# Patient Record
Sex: Female | Born: 1990 | Race: White | Hispanic: Yes | Marital: Single | Smoking: Former smoker
Health system: Southern US, Community
[De-identification: ages and names within clinical notes are randomized; demographics above are authoritative.]

## PROBLEM LIST (undated history)

## (undated) DIAGNOSIS — M5020 Other cervical disc displacement, unspecified cervical region: Secondary | ICD-10-CM

## (undated) HISTORY — PX: NO PAST SURGERIES: SHX2092

## (undated) HISTORY — PX: WISDOM TOOTH EXTRACTION: SHX21

## (undated) HISTORY — DX: Other cervical disc displacement, unspecified cervical region: M50.20

---

## 2014-03-04 ENCOUNTER — Emergency Department (HOSPITAL_BASED_OUTPATIENT_CLINIC_OR_DEPARTMENT_OTHER)
Admission: EM | Admit: 2014-03-04 | Discharge: 2014-03-04 | Disposition: A | Discharge status: Home / Self Care | Payer: PRIVATE HEALTH INSURANCE | Attending: Emergency Medicine | Admitting: Emergency Medicine

## 2014-03-04 ENCOUNTER — Encounter (HOSPITAL_BASED_OUTPATIENT_CLINIC_OR_DEPARTMENT_OTHER): Payer: Self-pay | Admitting: *Deleted

## 2014-03-04 DIAGNOSIS — M5441 Lumbago with sciatica, right side: Secondary | ICD-10-CM | POA: Insufficient documentation

## 2014-03-04 DIAGNOSIS — M545 Low back pain: Secondary | ICD-10-CM | POA: Diagnosis present

## 2014-03-04 MED ORDER — NAPROXEN 500 MG PO TABS: 500.0000 mg | ORAL_TABLET | Freq: Two times a day (BID) | ORAL | Status: DC

## 2014-03-04 MED ORDER — DIAZEPAM 5 MG PO TABS
5.0000 mg | ORAL_TABLET | Freq: Once | ORAL | Status: AC
  Administered 2014-03-04: 5 mg via ORAL
  Filled 2014-03-04: qty 1

## 2014-03-04 MED ORDER — OXYCODONE-ACETAMINOPHEN 5-325 MG PO TABS: 1.0000 | ORAL_TABLET | Freq: Four times a day (QID) | ORAL | Status: DC | PRN

## 2014-03-04 MED ORDER — IBUPROFEN 800 MG PO TABS
800.0000 mg | ORAL_TABLET | Freq: Once | ORAL | Status: AC
  Administered 2014-03-04: 800 mg via ORAL
  Filled 2014-03-04: qty 1

## 2014-03-04 MED ORDER — CYCLOBENZAPRINE HCL 5 MG PO TABS: 5.0000 mg | ORAL_TABLET | Freq: Three times a day (TID) | ORAL | Status: DC | PRN

## 2014-03-04 MED ORDER — PREDNISONE 50 MG PO TABS: 50.0000 mg | ORAL_TABLET | Freq: Every day | ORAL | Status: DC

## 2014-03-04 MED ORDER — OXYCODONE-ACETAMINOPHEN 5-325 MG PO TABS
1.0000 | ORAL_TABLET | Freq: Once | ORAL | Status: AC
Start: 2014-03-04 — End: 2014-03-04
  Administered 2014-03-04: 1 via ORAL
  Filled 2014-03-04: qty 1

## 2014-03-04 NOTE — ED Provider Notes (Signed)
CSN: [id number]     Arrival date & time 03/04/14  1508 History   First MD Initiated Contact with Patient 03/04/14 1514     Chief Complaint  Patient presents with  . Back Pain     (Consider location/radiation/quality/duration/timing/severity/associated sxs/prior Treatment) HPI  Pt presents with c/o low back pain.  She states she has been having pain for the past 2 weeks- she has been seeing a chiropractor and initially thought this was helping, however when she tried to do laundry and other household chores she had increased low back pain again.  Pain worse with movement and palpation.  No fever/chills.  No weakness of legs, no incontinence of bowel or bladder and no urinary retention.  She has been trying ibuprofen for her symptoms.  Her mother is requesting that she be given steroids as she had them once before and it helped mother a lot. There are no other associated systemic symptoms, there are no other alleviating or modifying factors.  No dysuria.   History reviewed. No pertinent past medical history. History reviewed. No pertinent past surgical history. No family history on file. History  Substance Use Topics  . Smoking status: Never Smoker   . Smokeless tobacco: Not on file  . Alcohol Use: No   OB History    No data available     Review of Systems  ROS reviewed and all otherwise negative except for mentioned in HPI    Allergies  Review of patient's allergies indicates no known allergies.  Home Medications   Prior to Admission medications   Medication Sig Start Date End Date Taking? Authorizing Provider  cyclobenzaprine (FLEXERIL) 5 MG tablet Take 1 tablet (5 mg total) by mouth 3 (three) times daily as needed for muscle spasms. 03/04/14   Ethelda Chick, MD  naproxen (NAPROSYN) 500 MG tablet Take 1 tablet (500 mg total) by mouth 2 (two) times daily. 03/04/14   Ethelda Chick, MD  oxyCODONE-acetaminophen (PERCOCET/ROXICET) 5-325 MG per tablet Take 1-2 tablets by mouth  every 6 (six) hours as needed for severe pain. 03/04/14   Ethelda Chick, MD  predniSONE (DELTASONE) 50 MG tablet Take 1 tablet (50 mg total) by mouth daily. 03/04/14   Ethelda Chick, MD   BP 150/79 mmHg  Pulse 84  Temp(Src) 98.7 F (37.1 C) (Oral)  Resp 18  SpO2 100%  LMP 02/03/2014  Vitals reviewed Physical Exam  Physical Examination: General appearance - alert, well appearing, and in no distress Mental status - alert, oriented to person, place, and time Eyes - no conjunctival injection, no scleral icterus Mouth - mucous membranes moist, pharynx normal without lesions Chest - clear to auscultation, no wheezes, rales or rhonchi, symmetric air entry Heart - normal rate, regular rhythm, normal S1, S2, no murmurs, rubs, clicks or gallops Back exam - no midline tenderness to palpation, right lumbar paraspinal tenderness, no CVA tenderness Neurological - alert, oriented x 3, strength 5/5 in extremities x 4, sensation intact Extremities - peripheral pulses normal, no pedal edema, no clubbing or cyanosis Skin - normal coloration and turgor, no rashes  ED Course  Procedures (including critical care time) Labs Review Labs Reviewed - No data to display  Imaging Review No results found.   EKG Interpretation None      MDM   Final diagnoses:  Right-sided low back pain with right-sided sciatica    Pt presenting with right sided low back pain.  No midline tendenress to warrant imaging, no trauma.  Neuro  exam normal.  No signs or symptoms of cauda equina.  No fever to suggest epidural abscess.  Pt treated with pain meds, muscle relaxer, steroid course.  Discharged with strict return precautions.  Pt agreeable with plan.    Ethelda ChickMartha K Linker, MD 03/07/14 1017

## 2014-03-04 NOTE — Discharge Instructions (Signed)
Return to the ED with any concerns including weakness of legs, not able to urinate, loss of control of bowel or bladder, decreased level of alertness/lethargy, or any other alarming symptoms °

## 2014-03-04 NOTE — ED Notes (Signed)
Lower back pain x 2 weeks. States she thinks she has sciatica.

## 2014-09-10 LAB — OB RESULTS CONSOLE GC/CHLAMYDIA
Chlamydia: NEGATIVE
Gonorrhea: NEGATIVE

## 2014-09-28 LAB — OB RESULTS CONSOLE VARICELLA ZOSTER ANTIBODY, IGG: VARICELLA IGG: IMMUNE

## 2014-09-28 LAB — OB RESULTS CONSOLE HGB/HCT, BLOOD
HCT: 35 %
Hemoglobin: 11.2 g/dL

## 2014-09-28 LAB — OB RESULTS CONSOLE HIV ANTIBODY (ROUTINE TESTING): HIV: NONREACTIVE

## 2014-09-28 LAB — OB RESULTS CONSOLE RUBELLA ANTIBODY, IGM: RUBELLA: IMMUNE

## 2014-09-28 LAB — OB RESULTS CONSOLE ABO/RH: RH Type: POSITIVE

## 2014-09-28 LAB — OB RESULTS CONSOLE HEPATITIS B SURFACE ANTIGEN: HEP B S AG: NEGATIVE

## 2014-09-28 LAB — OB RESULTS CONSOLE PLATELET COUNT: Platelets: 185 10*3/uL

## 2014-09-28 LAB — OB RESULTS CONSOLE ANTIBODY SCREEN: Antibody Screen: NEGATIVE

## 2014-11-24 ENCOUNTER — Encounter: Payer: Self-pay | Admitting: *Deleted

## 2014-11-24 DIAGNOSIS — Z3402 Encounter for supervision of normal first pregnancy, second trimester: Secondary | ICD-10-CM

## 2014-11-24 DIAGNOSIS — Z3400 Encounter for supervision of normal first pregnancy, unspecified trimester: Secondary | ICD-10-CM | POA: Insufficient documentation

## 2014-11-30 ENCOUNTER — Encounter: Payer: Self-pay | Admitting: Advanced Practice Midwife

## 2014-11-30 ENCOUNTER — Ambulatory Visit (INDEPENDENT_AMBULATORY_CARE_PROVIDER_SITE_OTHER): Payer: Self-pay | Admitting: Advanced Practice Midwife

## 2014-11-30 VITALS — BP 107/44 | HR 75 | Temp 98.6°F | Ht 67.0 in | Wt 236.0 lb

## 2014-11-30 DIAGNOSIS — Z3402 Encounter for supervision of normal first pregnancy, second trimester: Secondary | ICD-10-CM

## 2014-11-30 DIAGNOSIS — Z3A21 21 weeks gestation of pregnancy: Secondary | ICD-10-CM

## 2014-11-30 LAB — POCT URINALYSIS DIP (DEVICE)
Bilirubin Urine: NEGATIVE
Glucose, UA: NEGATIVE mg/dL
Hgb urine dipstick: NEGATIVE
Ketones, ur: NEGATIVE mg/dL
LEUKOCYTES UA: NEGATIVE
NITRITE: NEGATIVE
Protein, ur: NEGATIVE mg/dL
Specific Gravity, Urine: 1.025 (ref 1.005–1.030)
Urobilinogen, UA: 0.2 mg/dL (ref 0.0–1.0)
pH: 6 (ref 5.0–8.0)

## 2014-11-30 NOTE — Progress Notes (Signed)
Initial prenatal education packet given Breastfeeding tip of the week reviewed Unsure about flu

## 2014-11-30 NOTE — Progress Notes (Signed)
   Subjective:    Charlett NoseLianaivette [place] is a G1P0000 6752w3d being seen today for her first obstetrical visit as [facility].  Her obstetrical history is significant for none G1. Patient does intend to breast feed. Pregnancy history fully reviewed.  Patient reports no complaints.  Filed Vitals:   11/30/14 0818 11/30/14 0825  BP: 107/44   Pulse: 75   Temp: 98.6 F (37 C)   Height:  5\' 7"  (1.702 m)  Weight: 236 lb (107.049 kg)     HISTORY: OB History  Gravida Para Term Preterm AB SAB TAB Ectopic Multiple Living  1 0 0 0 0 0 0 0 0 0     # Outcome Date GA Lbr Len/2nd Weight Sex Delivery Anes PTL Lv  1 Current              Past Medical History  Diagnosis Date  . Herniated disc, cervical     sees chiropractor   Past Surgical History  Procedure Laterality Date  . No past surgeries     Family History  Problem Relation Age of Onset  . Asthma Mother   . Diabetes Maternal Aunt   . Diabetes Maternal Grandmother   . Diabetes Maternal Grandfather   . Diabetes Paternal Grandfather      Exam    Uterus:   Fundal height slightly above umbilicus  Pelvic Exam: Deferred--records reviewed, Pap wnl   Skin: normal coloration and turgor, no rashes    Neurologic: oriented, normal, gait normal; reflexes normal and symmetric   Extremities: normal strength, tone, and muscle mass, ROM of all joints is normal   HEENT sclera clear, anicteric   Mouth/Teeth mucous membranes moist, pharynx normal without lesions and dental hygiene good   Neck supple and no masses   Cardiovascular:    Respiratory:  appears well, vitals normal, no respiratory distress, acyanotic, normal RR, ear and throat exam is normal, neck free of mass or lymphadenopathy   Abdomen: soft, non-tender; bowel sounds normal; no masses,  no organomegaly   Urinary:       Assessment:    Pregnancy: G1P0000 Patient Active Problem List   Diagnosis Date Noted  . Supervision of normal first pregnancy 11/24/2014         Plan:     Initial labs reviewed in transfer records Continue prenatal vitamins. Problem list reviewed and updated. Genetic Screening discussed Quad Screen: results reviewed.  Ultrasound discussed; fetal survey: ordered.  Follow up in 3 weeks. Pt is self pay for today's visit, should have new insurance in 3 weeks.  US and next visit scheduled after insurance starts.  Discussed costs of visit with pt, consulted Mayra NeerKelly Rassette, and pt questions answered about billing.   LEFTWICH-KIRBY, Demontrae Gilbert 11/30/2014

## 2014-12-01 LAB — PRESCRIPTION MONITORING PROFILE (19 PANEL)
AMPHETAMINE/METH: NEGATIVE ng/mL
BARBITURATE SCREEN, URINE: NEGATIVE ng/mL
Benzodiazepine Screen, Urine: NEGATIVE ng/mL
Buprenorphine, Urine: NEGATIVE ng/mL
CANNABINOID SCRN UR: NEGATIVE ng/mL
CARISOPRODOL, URINE: NEGATIVE ng/mL
Cocaine Metabolites: NEGATIVE ng/mL
Creatinine, Urine: 186.66 mg/dL (ref >20.0)
ECSTASY: NEGATIVE ng/mL
Fentanyl, Ur: NEGATIVE ng/mL
MEPERIDINE UR: NEGATIVE ng/mL
Methadone Screen, Urine: NEGATIVE ng/mL
Methaqualone: NEGATIVE ng/mL
Nitrites, Initial: NEGATIVE ug/mL
OPIATE SCREEN, URINE: NEGATIVE ng/mL
Oxycodone Screen, Ur: NEGATIVE ng/mL
Phencyclidine, Ur: NEGATIVE ng/mL
Propoxyphene: NEGATIVE ng/mL
Tapentadol, urine: NEGATIVE ng/mL
Tramadol Scrn, Ur: NEGATIVE ng/mL
Zolpidem, Urine: NEGATIVE ng/mL
pH, Initial: 6.1 pH (ref 4.5–8.9)

## 2014-12-02 LAB — CULTURE, OB URINE
Colony Count: NO GROWTH
Organism ID, Bacteria: NO GROWTH

## 2014-12-21 ENCOUNTER — Encounter: Payer: Self-pay | Admitting: Family Medicine

## 2014-12-21 ENCOUNTER — Ambulatory Visit (INDEPENDENT_AMBULATORY_CARE_PROVIDER_SITE_OTHER): Payer: Self-pay | Admitting: Certified Nurse Midwife

## 2014-12-21 ENCOUNTER — Other Ambulatory Visit: Payer: Self-pay | Admitting: Advanced Practice Midwife

## 2014-12-21 ENCOUNTER — Ambulatory Visit (HOSPITAL_COMMUNITY)
Admission: RE | Admit: 2014-12-21 | Discharge: 2014-12-21 | Disposition: A | Discharge status: Home / Self Care | Payer: Medicaid Other | Source: Ambulatory Visit | Attending: Advanced Practice Midwife | Admitting: Advanced Practice Midwife

## 2014-12-21 VITALS — BP 119/69 | HR 75 | Temp 98.2°F | Wt 235.6 lb

## 2014-12-21 DIAGNOSIS — Z3A21 21 weeks gestation of pregnancy: Secondary | ICD-10-CM

## 2014-12-21 DIAGNOSIS — O99212 Obesity complicating pregnancy, second trimester: Secondary | ICD-10-CM | POA: Insufficient documentation

## 2014-12-21 DIAGNOSIS — Z3689 Encounter for other specified antenatal screening: Secondary | ICD-10-CM

## 2014-12-21 DIAGNOSIS — Z3A24 24 weeks gestation of pregnancy: Secondary | ICD-10-CM | POA: Diagnosis not present

## 2014-12-21 DIAGNOSIS — O0932 Supervision of pregnancy with insufficient antenatal care, second trimester: Secondary | ICD-10-CM | POA: Diagnosis not present

## 2014-12-21 DIAGNOSIS — Z3482 Encounter for supervision of other normal pregnancy, second trimester: Secondary | ICD-10-CM

## 2014-12-21 LAB — POCT URINALYSIS DIP (DEVICE)
Bilirubin Urine: NEGATIVE
Glucose, UA: NEGATIVE mg/dL
Hgb urine dipstick: NEGATIVE
Ketones, ur: NEGATIVE mg/dL
Nitrite: NEGATIVE
Protein, ur: NEGATIVE mg/dL
Specific Gravity, Urine: 1.01 (ref 1.005–1.030)
Urobilinogen, UA: 0.2 mg/dL (ref 0.0–1.0)
pH: 7 (ref 5.0–8.0)

## 2014-12-21 NOTE — Patient Instructions (Signed)
Glucose Tolerance Test During Pregnancy The glucose tolerance test (GTT) is a blood test used to determine if you have developed a type of diabetes during pregnancy (gestational diabetes). This is when your body does not properly process sugar (glucose) in the food you eat, resulting in high blood glucose levels. Typically, a GTT is done after you have had a 1-hour glucose test with results that indicate you possibly have gestational diabetes. It may also be done if:  You have a history of giving birth to very large babies or have experienced repeated fetal loss (stillbirth).   You have signs and symptoms of diabetes, such as:   Changes in your vision.   Tingling or numbness in your hands or feet.   Changes in hunger, thirst, and urination not otherwise explained by your pregnancy.  The GTT lasts about 3 hours. You will be given a sugar-water solution to drink at the beginning of the test. You will have blood drawn before you drink the solution and then again 1, 2, and 3 hours after you drink it. You will not be allowed to eat or drink anything else during the test. You must remain at the testing location to make sure that your blood is drawn on time. You should also avoid exercising during the test, because exercise can alter test results. PREPARATION FOR TEST  Eat normally for 3 days prior to the GTT test, including having plenty of carbohydrate-rich foods. Do not eat or drink anything except water during the final 12 hours before the test. In addition, your health care provider may ask you to stop taking certain medicines before the test. RESULTS  It is your responsibility to obtain your test results. Ask the lab or department performing the test when and how you will get your results. Contact your health care provider to discuss any questions you have about your results.  Range of Normal Values Ranges for normal values may vary among different labs and hospitals. You should always check  with your health care provider after having lab work or other tests done to discuss whether your values are considered within normal limits. Normal levels of blood glucose are as follows:  Fasting: less than 105 mg/dL.   1 hour after drinking the solution: less than 190 mg/dL.   2 hours after drinking the solution: less than 165 mg/dL.   3 hours after drinking the solution: less than 145 mg/dL.  Some substances can interfere with GTT results. These may include:  Blood pressure and heart failure medicines, including beta blockers, furosemide, and thiazides.   Anti-inflammatory medicines, including aspirin.   Nicotine.   Some psychiatric medicines.  Meaning of Results Outside Normal Value Ranges GTT test results that are above normal values may indicate a number of health problems, such as:   Gestational diabetes.   Acute stress response.   Cushing syndrome.   Tumors such as pheochromocytoma or glucagonoma.   Long-term kidney problems.   Pancreatitis.   Hyperthyroidism.   Current infection.  Discuss your test results with your health care provider. He or she will use the results to make a diagnosis and determine a treatment plan that is right for you.   This information is not intended to replace advice given to you by your health care provider. Make sure you discuss any questions you have with your health care provider.   Document Released: 06/26/2011 Document Revised: 01/15/2014 Document Reviewed: 05/01/2013 Elsevier Interactive Patient Education 2016 Elsevier Inc.  

## 2014-12-21 NOTE — Progress Notes (Signed)
Subjective:  Lisa Chang is a 24 y.o. G1P0000 at 7244w3d being seen today for ongoing prenatal care.  She is currently monitored for the following issues for this low-risk pregnancy and has Supervision of normal first pregnancy on her problem list.  Patient reports no complaints.  Contractions: Not present. Vag. Bleeding: None.  Movement: Present. Denies leaking of fluid.   The following portions of the patient's history were reviewed and updated as appropriate: allergies, current medications, past family history, past medical history, past social history, past surgical history and problem list. Problem list updated.  Objective:   Filed Vitals:   12/21/14 0954  BP: 119/69  Pulse: 75  Temp: 98.2 F (36.8 C)  Weight: 235 lb 9.6 oz (106.867 kg)    Fetal Status: Fetal Heart Rate (bpm): 137   Movement: Present     General:  Alert, oriented and cooperative. Patient is in no acute distress.  Skin: Skin is warm and dry. No rash noted.   Cardiovascular: Normal heart rate noted  Respiratory: Normal respiratory effort, no problems with respiration noted  Abdomen: Soft, gravid, appropriate for gestational age. Pain/Pressure: Absent     Pelvic: Vag. Bleeding: None     Cervical exam deferred        Extremities: Normal range of motion.  Edema: None  Mental Status: Normal mood and affect. Normal behavior. Normal judgment and thought content.   Urinalysis:      Assessment and Plan:  Pregnancy: G1P0000 at 2144w3d  There are no diagnoses linked to this encounter. Preterm labor symptoms and general obstetric precautions including but not limited to vaginal bleeding, contractions, leaking of fluid and fetal movement were reviewed in detail with the patient. Please refer to After Visit Summary for other counseling recommendations.  Return in about 4 weeks (around 01/18/2015).   Rhea PinkLori A Clemmons, CNM

## 2015-01-09 NOTE — L&D Delivery Note (Signed)
Delivery Note Pt labored after cytotec 50mcg PO x 2 doses. Rec'd Fentanyl IV for pain. She became complete, pushed with 3 contractions, and at 7:31 AM a viable female was delivered via Vaginal, Spontaneous Delivery (Presentation: Left Occiput Anterior).  APGAR: 9, 10; weight 6 lb 11.6 oz (3050 g).  Cord clamped and cut by FOB; hospital cord blood sample collected. Placenta status: Intact, Spontaneous.  Cord: 3 vessels with the following complications: None.    Anesthesia: None  Episiotomy: None Lacerations: None Est. Blood Loss (mL): 50  Mom to postpartum.  Baby to Couplet care / Skin to Skin.   Cam HaiSHAW, KIMBERLY CNM 04/09/2015, 7:56 AM

## 2015-01-11 ENCOUNTER — Telehealth: Payer: Self-pay | Admitting: General Practice

## 2015-01-11 NOTE — Telephone Encounter (Signed)
Patient called into front office reporting extreme tiredness and dizziness today. Patient reports no recent changes in health. Patient states her symptoms were so severe she had to leave work to go home and lay down. Told patient to go to the pharmacy to have her blood pressure checked to make sure that's normal. Also recommended she drink plenty of water and make sure she is eating enough and recommended she go home and lay down to see if she feels better. Patient verbalized understanding & had no other questions

## 2015-01-17 ENCOUNTER — Ambulatory Visit (INDEPENDENT_AMBULATORY_CARE_PROVIDER_SITE_OTHER): Payer: Medicaid Other | Admitting: Obstetrics and Gynecology

## 2015-01-17 VITALS — BP 117/55 | HR 78 | Temp 98.4°F | Wt 236.3 lb

## 2015-01-17 DIAGNOSIS — Z3403 Encounter for supervision of normal first pregnancy, third trimester: Secondary | ICD-10-CM

## 2015-01-17 LAB — CBC
HCT: 32.7 % — ABNORMAL LOW (ref 36.0–46.0)
HEMOGLOBIN: 11.1 g/dL — AB (ref 12.0–15.0)
MCH: 28.8 pg (ref 26.0–34.0)
MCHC: 33.9 g/dL (ref 30.0–36.0)
MCV: 84.9 fL (ref 78.0–100.0)
MPV: 12 fL (ref 8.6–12.4)
Platelets: 259 10*3/uL (ref 150–400)
RBC: 3.85 MIL/uL — AB (ref 3.87–5.11)
RDW: 13.2 % (ref 11.5–15.5)
WBC: 8.4 10*3/uL (ref 4.0–10.5)

## 2015-01-17 LAB — POCT URINALYSIS DIP (DEVICE)
BILIRUBIN URINE: NEGATIVE
Glucose, UA: NEGATIVE mg/dL
Hgb urine dipstick: NEGATIVE
KETONES UR: NEGATIVE mg/dL
Nitrite: NEGATIVE
PH: 7 (ref 5.0–8.0)
PROTEIN: NEGATIVE mg/dL
Specific Gravity, Urine: 1.015 (ref 1.005–1.030)
Urobilinogen, UA: 0.2 mg/dL (ref 0.0–1.0)

## 2015-01-17 NOTE — Progress Notes (Signed)
Subjective:  Lisa Chang is a 25 y.o. G1P0000 at 3013w2d being seen today for ongoing prenatal care.  She is currently monitored for the following issues for this low-risk pregnancy and has Supervision of normal first pregnancy on her problem list.  Patient reports fatigue.  Contractions: Not present. Vag. Bleeding: None.  Movement: Present. Denies leaking of fluid.   The following portions of the patient's history were reviewed and updated as appropriate: allergies, current medications, past family history, past medical history, past social history, past surgical history and problem list. Problem list updated.  Objective:   Filed Vitals:   01/17/15 1056  BP: 117/55  Pulse: 78  Temp: 98.4 F (36.9 C)  Weight: 236 lb 4.8 oz (107.185 kg)    Fetal Status: Fetal Heart Rate (bpm): 126   Movement: Present     General:  Alert, oriented and cooperative. Patient is in no acute distress.  Skin: Skin is warm and dry. No rash noted.   Cardiovascular: Normal heart rate noted  Respiratory: Normal respiratory effort, no problems with respiration noted  Abdomen: Soft, gravid, appropriate for gestational age. Pain/Pressure: Present     Pelvic: Vag. Bleeding: None     Cervical exam deferred        Extremities: Normal range of motion.  Edema: None  Mental Status: Normal mood and affect. Normal behavior. Normal judgment and thought content.   Urinalysis: Urine Protein: Negative Urine Glucose: Negative  Assessment and Plan:  Pregnancy: G1P0000 at 613w2d  1. Encounter for supervision of normal first pregnancy in third trimester - Glucose Tolerance, 1 HR (50g) w/o Fasting - HIV antibody (with reflex) - CBC - RPR  Preterm labor symptoms and general obstetric precautions including but not limited to vaginal bleeding, contractions, leaking of fluid and fetal movement were reviewed in detail with the patient. Please refer to After Visit Summary for other counseling recommendations.  No  Follow-up on file.   Kathrynn RunningNoah Bedford Rollo Farquhar, MD

## 2015-01-17 NOTE — Progress Notes (Signed)
Breastfeeding tip of the week reviewed 28 week labs, gtt today

## 2015-01-18 LAB — RPR

## 2015-01-18 LAB — GLUCOSE TOLERANCE, 1 HOUR (50G) W/O FASTING: Glucose, 1 Hour GTT: 98 mg/dL (ref 70–140)

## 2015-01-18 LAB — HIV ANTIBODY (ROUTINE TESTING W REFLEX): HIV 1&2 Ab, 4th Generation: NONREACTIVE

## 2015-01-20 ENCOUNTER — Encounter: Payer: Self-pay | Admitting: Family Medicine

## 2015-02-01 ENCOUNTER — Encounter: Payer: Self-pay | Admitting: Advanced Practice Midwife

## 2015-02-01 ENCOUNTER — Ambulatory Visit (INDEPENDENT_AMBULATORY_CARE_PROVIDER_SITE_OTHER): Payer: Medicaid Other | Admitting: Advanced Practice Midwife

## 2015-02-01 VITALS — BP 122/75 | HR 95 | Temp 98.4°F | Wt 239.8 lb

## 2015-02-01 DIAGNOSIS — Z3403 Encounter for supervision of normal first pregnancy, third trimester: Secondary | ICD-10-CM | POA: Diagnosis present

## 2015-02-01 LAB — POCT URINALYSIS DIP (DEVICE)
BILIRUBIN URINE: NEGATIVE
Glucose, UA: NEGATIVE mg/dL
HGB URINE DIPSTICK: NEGATIVE
KETONES UR: NEGATIVE mg/dL
Nitrite: NEGATIVE
PH: 7.5 (ref 5.0–8.0)
Protein, ur: 30 mg/dL — AB
Specific Gravity, Urine: 1.02 (ref 1.005–1.030)
Urobilinogen, UA: 0.2 mg/dL (ref 0.0–1.0)

## 2015-02-01 NOTE — Progress Notes (Signed)
Subjective:  Lisa Chang is a 25 y.o. G1P0000 at [redacted]w[redacted]d being seen today for ongoing prenatal care.  She is currently monitored for the following issues for this low-risk pregnancy and has Supervision of normal first pregnancy on her problem list.  Patient reports occasional contractions and round ligament pain, discharge entire pregnancy (no change).  Contractions: Not present. Vag. Bleeding: None.  Movement: Present. Denies leaking of fluid.   The following portions of the patient's history were reviewed and updated as appropriate: allergies, current medications, past family history, past medical history, past social history, past surgical history and problem list. Problem list updated.  Objective:   Filed Vitals:   02/01/15 1030  BP: 122/75  Pulse: 95  Temp: 98.4 F (36.9 C)  Weight: 239 lb 12.8 oz (108.773 kg)    Fetal Status: Fetal Heart Rate (bpm): 145   Movement: Present     General:  Alert, oriented and cooperative. Patient is in no acute distress.  Skin: Skin is warm and dry. No rash noted.   Cardiovascular: Normal heart rate noted  Respiratory: Normal respiratory effort, no problems with respiration noted  Abdomen: Soft, gravid, appropriate for gestational age. Pain/Pressure: Present     Pelvic: Vag. Bleeding: None     Cervical exam deferred        Extremities: Normal range of motion.  Edema: Trace  Mental Status: Normal mood and affect. Normal behavior. Normal judgment and thought content.   Urinalysis: Urine Protein: 1+ Urine Glucose: Negative  Assessment and Plan:  Pregnancy: G1P0000 at [redacted]w[redacted]d Round ligament pain Occasional contractions (5 per day) Vaginal discharge, declines evaluation. No change in it entire pregnancy   There are no diagnoses linked to this encounter. Preterm labor symptoms and general obstetric precautions including but not limited to vaginal bleeding, contractions, leaking of fluid and fetal movement were reviewed in detail with the  patient. Please refer to After Visit Summary for other counseling recommendations.   RTC 2 weeks   Aviva Signs, CNM

## 2015-02-01 NOTE — Progress Notes (Signed)
C/o cramping - like getting period daily .

## 2015-02-01 NOTE — Patient Instructions (Signed)

## 2015-02-15 ENCOUNTER — Ambulatory Visit (INDEPENDENT_AMBULATORY_CARE_PROVIDER_SITE_OTHER): Payer: Medicaid Other | Admitting: Family

## 2015-02-15 ENCOUNTER — Encounter: Payer: Medicaid Other | Admitting: Family

## 2015-02-15 VITALS — BP 123/78 | HR 101 | Temp 98.4°F | Wt 238.2 lb

## 2015-02-15 DIAGNOSIS — Z3403 Encounter for supervision of normal first pregnancy, third trimester: Secondary | ICD-10-CM | POA: Diagnosis not present

## 2015-02-15 LAB — POCT URINALYSIS DIP (DEVICE)
Glucose, UA: NEGATIVE mg/dL
HGB URINE DIPSTICK: NEGATIVE
NITRITE: NEGATIVE
PROTEIN: 30 mg/dL — AB
Specific Gravity, Urine: >=1.03 (ref 1.005–1.030)
Urobilinogen, UA: 1 mg/dL (ref 0.0–1.0)
pH: 6 (ref 5.0–8.0)

## 2015-02-15 NOTE — Progress Notes (Signed)
Declined tdap

## 2015-02-15 NOTE — Progress Notes (Signed)
Subjective:  Lisa Chang is a 25 y.o. G1P0000 at [redacted]w[redacted]d being seen today for ongoing prenatal care.  She is currently monitored for the following issues for this low-risk pregnancy and has Supervision of normal first pregnancy on her problem list.  Patient reports no complaints.  Contractions: Not present. Vag. Bleeding: None.  Movement: Present. Denies leaking of fluid.   The following portions of the patient's history were reviewed and updated as appropriate: allergies, current medications, past family history, past medical history, past social history, past surgical history and problem list. Problem list updated.  Objective:   Filed Vitals:   02/15/15 1023  BP: 123/78  Pulse: 101  Temp: 98.4 F (36.9 C)  Weight: 238 lb 3.2 oz (108.047 kg)    Fetal Status: Fetal Heart Rate (bpm): 142 Fundal Height: 33 cm Movement: Present     General:  Alert, oriented and cooperative. Patient is in no acute distress.  Skin: Skin is warm and dry. No rash noted.   Cardiovascular: Normal heart rate noted  Respiratory: Normal respiratory effort, no problems with respiration noted  Abdomen: Soft, gravid, appropriate for gestational age. Pain/Pressure: Present     Pelvic: Vag. Bleeding: None     Cervical exam deferred        Extremities: Normal range of motion.  Edema: None  Mental Status: Normal mood and affect. Normal behavior. Normal judgment and thought content.   Urinalysis: Urine Protein: 1+ Urine Glucose: Negative  Assessment and Plan:  Pregnancy: G1P0000 at [redacted]w[redacted]d  1. Encounter for supervision of normal first pregnancy in third trimester - [place] MFM OB FOLLOW UP; Future for incomplete ultrasound   Preterm labor symptoms and general obstetric precautions including but not limited to vaginal bleeding, contractions, leaking of fluid and fetal movement were reviewed in detail with the patient. Please refer to After Visit Summary for other counseling recommendations.  Return in about 2  weeks (around 03/01/2015).   Eino Farber Kennith Gain, CNM

## 2015-02-15 NOTE — Patient Instructions (Addendum)
AREA PEDIATRIC/FAMILY PRACTICE PHYSICIANS  [facility] Address: [address] [place], [place], [place] [address]  [phone]  [facility] [address] [place], [place] [address] Phone - [phone]   Fax - [phone]  [facility] [address] [place], [place]  [address] Phone - [phone]   Fax - [phone]  [facility] [address] [place], [place]  [address] Phone - [phone]   Fax - [phone]  [facility] [address] [place], [place]  [address] Phone - [phone]   Fax - [phone]  [facility] [address] [place], [place]  [address] Phone - [phone]   Fax - [phone]  [facility] [address] [place], [place]  [address] Phone - [phone]   Fax - [phone]  [facility] [place] [address] [place], [place]  [address] Phone - [phone]   Fax - [phone]  [facility] AT [facility] [address] [place], [address] [place], [place]  [address] Phone - [phone]   Fax - [phone]  [facility] AT [facility] [address] [place], [place]  [address] Phone - [phone]   Fax - [phone] [facility] [place] [address] [place], [place]  [address] Phone - [phone]   Fax - [phone]  [facility] [address] [place], [place]  [address] Phone - [phone]   Fax - [phone]  [facility] [address], [address] [place], [place]  [address] Phone - [phone]   Fax - [phone]  [facility] [place] [address] [place], [place]  [address] Phone - [phone]   Fax - [phone]  [facility] [address], [address] [place], [place]  [address] Phone - [phone]  [facility] [address]  [place], [place]  [address] Phone - [phone]   Fax - [phone]  [facility] [address] [place], [place]  [address] Phone - [phone]   Fax - [phone]  [facility] [address] [place], [place]  [address] Phone - [phone]   Fax - [phone]  [facility] [address] [place], [place]  [address] Phone - [phone]   Fax - [phone]   [facility] [address] [place], [place]  [address] Phone - [phone]   Fax - [phone]  [facility] [address] [place], [place]  [address] Phone - [phone]   Fax - [phone]  DA[facility] RUBIN [address] [place], [place]  [address] Phone - [phone]   Fax - [phone]  [facility] [address] [place], [place]  [address] Phone - [phone]   Fax - [phone]  [facility] Al[facility] Ch[facility] [address] [place], [place]  [address] Phone - [phone]   Fax - [phone] Gerarda Fraction [address] [place], [place]  [address] Phone - [phone]   Fax - [phone]  [facility] [address] [place], [place]  [address] Phone - [phone]   Fax - [phone]  [facility] -  [address] [place], [place]  [address] Phone - [phone]   Fax - [phone]

## 2015-02-22 ENCOUNTER — Ambulatory Visit (HOSPITAL_COMMUNITY)
Admission: RE | Admit: 2015-02-22 | Discharge: 2015-02-22 | Disposition: A | Discharge status: Home / Self Care | Payer: Managed Care, Other (non HMO) | Source: Ambulatory Visit | Attending: Family | Admitting: Family

## 2015-02-22 DIAGNOSIS — O0933 Supervision of pregnancy with insufficient antenatal care, third trimester: Secondary | ICD-10-CM | POA: Insufficient documentation

## 2015-02-22 DIAGNOSIS — O99212 Obesity complicating pregnancy, second trimester: Secondary | ICD-10-CM | POA: Diagnosis not present

## 2015-02-22 DIAGNOSIS — Z3403 Encounter for supervision of normal first pregnancy, third trimester: Secondary | ICD-10-CM

## 2015-02-22 DIAGNOSIS — Z36 Encounter for antenatal screening of mother: Secondary | ICD-10-CM | POA: Diagnosis not present

## 2015-02-22 DIAGNOSIS — Z3A33 33 weeks gestation of pregnancy: Secondary | ICD-10-CM | POA: Insufficient documentation

## 2015-03-01 ENCOUNTER — Ambulatory Visit (INDEPENDENT_AMBULATORY_CARE_PROVIDER_SITE_OTHER): Payer: Medicaid Other | Admitting: Advanced Practice Midwife

## 2015-03-01 VITALS — BP 116/77 | HR 116 | Wt 238.0 lb

## 2015-03-01 DIAGNOSIS — Z3403 Encounter for supervision of normal first pregnancy, third trimester: Secondary | ICD-10-CM

## 2015-03-01 LAB — POCT URINALYSIS DIP (DEVICE)
Bilirubin Urine: NEGATIVE
GLUCOSE, UA: NEGATIVE mg/dL
Hgb urine dipstick: NEGATIVE
Ketones, ur: NEGATIVE mg/dL
NITRITE: NEGATIVE
PH: 7 (ref 5.0–8.0)
PROTEIN: NEGATIVE mg/dL
Specific Gravity, Urine: 1.015 (ref 1.005–1.030)
UROBILINOGEN UA: 0.2 mg/dL (ref 0.0–1.0)

## 2015-03-01 NOTE — Progress Notes (Signed)
Pt c/o allergy symptoms, list of meds safe for pregnancy given to her.

## 2015-03-01 NOTE — Progress Notes (Signed)
Subjective:  Shaqueena [place] is a 25 y.o. G1P0000 at 100w3d being seen today for ongoing prenatal care.  She is currently monitored for the following issues for this low-risk pregnancy and has Supervision of normal first pregnancy on her problem list.  Patient reports no complaints.  Contractions: Not present. Vag. Bleeding: None.  Movement: Present. Denies leaking of fluid.   The following portions of the patient's history were reviewed and updated as appropriate: allergies, current medications, past family history, past medical history, past social history, past surgical history and problem list. Problem list updated.  Objective:   Filed Vitals:   03/01/15 1052  BP: 116/77  Pulse: 116  Weight: 238 lb (107.956 kg)    Fetal Status: Fetal Heart Rate (bpm): 156 Fundal Height: 35 cm Movement: Present     General:  Alert, oriented and cooperative. Patient is in no acute distress.  Skin: Skin is warm and dry. No rash noted.   Cardiovascular: Normal heart rate noted  Respiratory: Normal respiratory effort, no problems with respiration noted  Abdomen: Soft, gravid, appropriate for gestational age. Pain/Pressure: Present     Pelvic: Vag. Bleeding: None     Cervical exam deferred        Extremities: Normal range of motion.  Edema: None  Mental Status: Normal mood and affect. Normal behavior. Normal judgment and thought content.   Urinalysis: Urine Protein: Negative Urine Glucose: Negative  Assessment and Plan:  Pregnancy: G1P0000 at [redacted]w[redacted]d  1. Encounter for supervision of normal first pregnancy in third trimester   Preterm labor symptoms and general obstetric precautions including but not limited to vaginal bleeding, contractions, leaking of fluid and fetal movement were reviewed in detail with the patient. Please refer to After Visit Summary for other counseling recommendations.  No Follow-up on file.   Hurshel Party, CNM

## 2015-03-15 ENCOUNTER — Ambulatory Visit (INDEPENDENT_AMBULATORY_CARE_PROVIDER_SITE_OTHER): Payer: 59 | Admitting: Family

## 2015-03-15 VITALS — BP 112/67 | HR 94 | Temp 98.5°F | Wt 241.2 lb

## 2015-03-15 DIAGNOSIS — Z3403 Encounter for supervision of normal first pregnancy, third trimester: Secondary | ICD-10-CM

## 2015-03-15 LAB — POCT URINALYSIS DIP (DEVICE)
Bilirubin Urine: NEGATIVE
GLUCOSE, UA: NEGATIVE mg/dL
Hgb urine dipstick: NEGATIVE
Ketones, ur: NEGATIVE mg/dL
NITRITE: NEGATIVE
Protein, ur: NEGATIVE mg/dL
Specific Gravity, Urine: 1.02 (ref 1.005–1.030)
Urobilinogen, UA: 0.2 mg/dL (ref 0.0–1.0)
pH: 7.5 (ref 5.0–8.0)

## 2015-03-15 LAB — OB RESULTS CONSOLE GBS: GBS: NEGATIVE

## 2015-03-15 MED ORDER — OSELTAMIVIR PHOSPHATE 75 MG PO CAPS: 75.0000 mg | ORAL_CAPSULE | Freq: Two times a day (BID) | ORAL | Status: DC

## 2015-03-15 NOTE — Progress Notes (Signed)
Subjective:  Charlett NoseLianaivette [place] is a 25 y.o. G1P0000 at 644w3d being seen today for ongoing prenatal care.  She is currently monitored for the following issues for this low-risk pregnancy and has Supervision of normal first pregnancy on her problem list.  Patient reports body aches and chills x one day.  Denies fever or any other symptoms.  .  Contractions: Irritability. Vag. Bleeding: None.  Movement: Present. Denies leaking of fluid.   The following portions of the patient's history were reviewed and updated as appropriate: allergies, current medications, past family history, past medical history, past social history, past surgical history and problem list. Problem list updated.  Objective:   Filed Vitals:   03/15/15 1039  BP: 112/67  Pulse: 94  Temp: 98.5 F (36.9 C)  Weight: 241 lb 3.2 oz (109.408 kg)    Fetal Status: Fetal Heart Rate (bpm): 143 Fundal Height: 36 cm Movement: Present  Presentation: Vertex  General:  Alert, oriented and cooperative. Patient is in no acute distress.  Skin: Skin is warm and dry. No rash noted.   Cardiovascular: Normal heart rate noted  Respiratory: Normal respiratory effort, no problems with respiration noted  Abdomen: Soft, gravid, appropriate for gestational age. Pain/Pressure: Present     Pelvic: Vag. Bleeding: None     Cervical exam performed Dilation: Closed Effacement (%): Thick    Extremities: Normal range of motion.  Edema: None  Mental Status: Normal mood and affect. Normal behavior. Normal judgment and thought content.   Urinalysis: Urine Protein: Negative Urine Glucose: Negative  Assessment and Plan:  Pregnancy: G1P0000 at 714w3d  1. Encounter for supervision of normal first pregnancy in third trimester - Culture, beta strep (group b only) - GC[facility]Chlamydia probe amp ([place]  2. Body Aches and Chills - RX Tamiflu sent to pharmacy; if fever develops in next 48 hours begin taking  Preterm labor symptoms and general  obstetric precautions including but not limited to vaginal bleeding, contractions, leaking of fluid and fetal movement were reviewed in detail with the patient. Please refer to After Visit Summary for other counseling recommendations.  Return in about 1 week (around 03/22/2015).   Eino FarberWalidah Kennith GainN Karim, CNM

## 2015-03-15 NOTE — Progress Notes (Signed)
36 wk cultures  Pt reports having chills and generalized aches

## 2015-03-17 LAB — CULTURE, BETA STREP (GROUP B ONLY)

## 2015-03-22 ENCOUNTER — Encounter: Payer: Self-pay | Admitting: *Deleted

## 2015-03-22 ENCOUNTER — Encounter: Payer: Self-pay | Admitting: Student

## 2015-03-22 ENCOUNTER — Ambulatory Visit (INDEPENDENT_AMBULATORY_CARE_PROVIDER_SITE_OTHER): Payer: 59 | Admitting: Student

## 2015-03-22 ENCOUNTER — Telehealth: Payer: Self-pay | Admitting: Family Medicine

## 2015-03-22 ENCOUNTER — Other Ambulatory Visit (HOSPITAL_COMMUNITY)
Admission: RE | Admit: 2015-03-22 | Discharge: 2015-03-22 | Disposition: A | Discharge status: Home / Self Care | Payer: 59 | Source: Ambulatory Visit | Attending: Student | Admitting: Student

## 2015-03-22 VITALS — BP 116/82 | HR 81 | Wt 243.6 lb

## 2015-03-22 DIAGNOSIS — Z3403 Encounter for supervision of normal first pregnancy, third trimester: Secondary | ICD-10-CM

## 2015-03-22 DIAGNOSIS — Z113 Encounter for screening for infections with a predominantly sexual mode of transmission: Secondary | ICD-10-CM | POA: Insufficient documentation

## 2015-03-22 LAB — POCT URINALYSIS DIP (DEVICE)
BILIRUBIN URINE: NEGATIVE
Glucose, UA: NEGATIVE mg/dL
Hgb urine dipstick: NEGATIVE
Ketones, ur: NEGATIVE mg/dL
Nitrite: NEGATIVE
Protein, ur: NEGATIVE mg/dL
SPECIFIC GRAVITY, URINE: 1.02 (ref 1.005–1.030)
Urobilinogen, UA: 0.2 mg/dL (ref 0.0–1.0)
pH: 7 (ref 5.0–8.0)

## 2015-03-22 LAB — OB RESULTS CONSOLE GC/CHLAMYDIA: Gonorrhea: NEGATIVE

## 2015-03-22 NOTE — Telephone Encounter (Signed)
Patient called, said her Job took her out of work because she have been missing days from work due to not feeling well, she her job is requesting a letter saying when she can return back to work.

## 2015-03-22 NOTE — Patient Instructions (Signed)
Braxton Hicks Contractions °Contractions of the uterus can occur throughout pregnancy. Contractions are not always a sign that you are in labor.  °WHAT ARE BRAXTON HICKS CONTRACTIONS?  °Contractions that occur before labor are called Braxton Hicks contractions, or false labor. Toward the end of pregnancy (32-34 weeks), these contractions can develop more often and may become more forceful. This is not true labor because these contractions do not result in opening (dilatation) and thinning of the cervix. They are sometimes difficult to tell apart from true labor because these contractions can be forceful and people have different pain tolerances. You should not feel embarrassed if you go to the hospital with false labor. Sometimes, the only way to tell if you are in true labor is for your health care provider to look for changes in the cervix. °If there are no prenatal problems or other health problems associated with the pregnancy, it is completely safe to be sent home with false labor and await the onset of true labor. °HOW CAN YOU TELL THE DIFFERENCE BETWEEN TRUE AND FALSE LABOR? °False Labor °· The contractions of false labor are usually shorter and not as hard as those of true labor.   °· The contractions are usually irregular.   °· The contractions are often felt in the front of the lower abdomen and in the groin.   °· The contractions may go away when you walk around or change positions while lying down.   °· The contractions get weaker and are shorter lasting as time goes on.   °· The contractions do not usually become progressively stronger, regular, and closer together as with true labor.   °True Labor °· Contractions in true labor last 30-70 seconds, become very regular, usually become more intense, and increase in frequency.   °· The contractions do not go away with walking.   °· The discomfort is usually felt in the top of the uterus and spreads to the lower abdomen and low back.   °· True labor can be  determined by your health care provider with an exam. This will show that the cervix is dilating and getting thinner.   °WHAT TO REMEMBER °· Keep up with your usual exercises and follow other instructions given by your health care provider.   °· Take medicines as directed by your health care provider.   °· Keep your regular prenatal appointments.   °· Eat and drink lightly if you think you are going into labor.   °· If Braxton Hicks contractions are making you uncomfortable:   °¨ Change your position from lying down or resting to walking, or from walking to resting.   °¨ Sit and rest in a tub of warm water.   °¨ Drink 2-3 glasses of water. Dehydration may cause these contractions.   °¨ Do slow and deep breathing several times an hour.   °WHEN SHOULD I SEEK IMMEDIATE MEDICAL CARE? °Seek immediate medical care if: °· Your contractions become stronger, more regular, and closer together.   °· You have fluid leaking or gushing from your vagina.   °· You have a fever.   °· You pass blood-tinged mucus.   °· You have vaginal bleeding.   °· You have continuous abdominal pain.   °· You have low back pain that you never had before.   °· You feel your baby's head pushing down and causing pelvic pressure.   °· Your baby is not moving as much as it used to.   °  °This information is not intended to replace advice given to you by your health care provider. Make sure you discuss any questions you have with your health care   provider. °  °Document Released: 12/25/2004 Document Revised: 12/30/2012 Document Reviewed: 10/06/2012 °Elsevier Interactive Patient Education ©2016 [employer] ° °

## 2015-03-22 NOTE — Progress Notes (Signed)
Subjective:  Lisa Chang is a 25 y.o. G1P0000 at 4251w3d being seen today for ongoing prenatal care.  She is currently monitored for the following issues for this low-risk pregnancy and has Supervision of normal first pregnancy on her problem list.  Patient reports no complaints.  Contractions: Not present. Vag. Bleeding: None.  Movement: Present. Denies leaking of fluid.   The following portions of the patient's history were reviewed and updated as appropriate: allergies, current medications, past family history, past medical history, past social history, past surgical history and problem list. Problem list updated.  Objective:   Filed Vitals:   03/22/15 1029  BP: 116/82  Pulse: 81  Weight: 243 lb 9.6 oz (110.496 kg)    Fetal Status: Fetal Heart Rate (bpm): 146   Movement: Present     General:  Alert, oriented and cooperative. Patient is in no acute distress.  Skin: Skin is warm and dry. No rash noted.   Cardiovascular: Normal heart rate noted  Respiratory: Normal respiratory effort, no problems with respiration noted  Abdomen: Soft, gravid, appropriate for gestational age. Pain/Pressure: Present     Fundal height 37 cm  Pelvic: Vag. Bleeding: None     Cervical exam deferred      Pt declined  Extremities: Normal range of motion.     Mental Status: Normal mood and affect. Normal behavior. Normal judgment and thought content.   Urinalysis: Urine Protein: Negative Urine Glucose: Negative  Assessment and Plan:  Pregnancy: G1P0000 at 551w3d  1. Encounter for supervision of normal first pregnancy in third trimester  - GC/Chlamydia probe amp ([place]  Term labor symptoms and general obstetric precautions including but not limited to vaginal bleeding, contractions, leaking of fluid and fetal movement were reviewed in detail with the patient. Please refer to After Visit Summary for other counseling recommendations.  Return in about 1 week (around 03/29/2015) for  Routine OB.   Judeth HornErin Niel Peretti, NP

## 2015-03-23 LAB — GC/CHLAMYDIA PROBE AMP (~~LOC~~) NOT AT ARMC
Chlamydia: NEGATIVE
Neisseria Gonorrhea: NEGATIVE

## 2015-03-23 NOTE — Telephone Encounter (Signed)
Spoke with patient she would like the clinic to write a letter stating when she is due to delivered her baby and how long she should be out of work. Approximately six weeks. Pt plans to have a vaginal delivery. I advised patient we would have this letter ready by Friday. Pt request we fax this letter to claim department and include this number #[id number]#[phone] FAX NUMBER IS [phone]

## 2015-03-30 ENCOUNTER — Ambulatory Visit (INDEPENDENT_AMBULATORY_CARE_PROVIDER_SITE_OTHER): Payer: 59 | Admitting: Family Medicine

## 2015-03-30 VITALS — BP 122/71 | HR 91 | Temp 98.4°F | Wt 244.4 lb

## 2015-03-30 DIAGNOSIS — Z3403 Encounter for supervision of normal first pregnancy, third trimester: Secondary | ICD-10-CM | POA: Diagnosis not present

## 2015-03-30 LAB — POCT URINALYSIS DIP (DEVICE)
BILIRUBIN URINE: NEGATIVE
Glucose, UA: NEGATIVE mg/dL
Ketones, ur: NEGATIVE mg/dL
Leukocytes, UA: NEGATIVE
Nitrite: NEGATIVE
Protein, ur: NEGATIVE mg/dL
Specific Gravity, Urine: 1.025 (ref 1.005–1.030)
Urobilinogen, UA: 0.2 mg/dL (ref 0.0–1.0)
pH: 6.5 (ref 5.0–8.0)

## 2015-03-30 NOTE — Patient Instructions (Signed)
Come to the MAU (maternity admission unit) for 1) Strong contractions every 2-3 minutes for at least 1 hour that do not go away when you drink water or take a warm shower. These contractions will be so strong all you can do is breath through them 2) Vaginal bleeding- anything more than spotting 3) Loss of fluid like you broke your water 4) Decreased movement of your baby  

## 2015-03-30 NOTE — Progress Notes (Signed)
Breastfeeding tip of the week reviewed. 

## 2015-03-30 NOTE — Progress Notes (Signed)
Subjective:  Lisa Chang is a 25 y.o. G1P0000 at 164w4d being seen today for ongoing prenatal care.  She is currently monitored for the following issues for this low-risk pregnancy and has Supervision of normal first pregnancy on her problem list.  Patient reports no complaints.  Contractions: Irritability. Vag. Bleeding: None.  Movement: Present. Denies leaking of fluid.   The following portions of the patient's history were reviewed and updated as appropriate: allergies, current medications, past family history, past medical history, past social history, past surgical history and problem list. Problem list updated.  Objective:   Filed Vitals:   03/30/15 1017  BP: 122/71  Pulse: 91  Temp: 98.4 F (36.9 C)  Weight: 244 lb 6.4 oz (110.859 kg)    Fetal Status: Fetal Heart Rate (bpm): 134   Movement: Present     General:  Alert, oriented and cooperative. Patient is in no acute distress.  Skin: Skin is warm and dry. No rash noted.   Cardiovascular: Normal heart rate noted  Respiratory: Normal respiratory effort, no problems with respiration noted  Abdomen: Soft, gravid, appropriate for gestational age. Pain/Pressure: Absent     Pelvic: Vag. Bleeding: None     Cervical exam deferred        Extremities: Normal range of motion.  Edema: Trace  Mental Status: Normal mood and affect. Normal behavior. Normal judgment and thought content.   Urinalysis: Urine Protein: Negative Urine Glucose: Negative  Assessment and Plan:  Pregnancy: G1P0000 at 5064w4d  1. Encounter for supervision of normal first pregnancy in third trimester -updated box -reviewed that patient desired circ -reviewed labor precautions  Term labor symptoms and general obstetric precautions including but not limited to vaginal bleeding, contractions, leaking of fluid and fetal movement were reviewed in detail with the patient. Please refer to After Visit Summary for other counseling recommendations.   Return in  about 1 week (around 04/06/2015) for Routine prenatal care.   Federico FlakeKimberly Niles Newton, MD

## 2015-04-05 ENCOUNTER — Ambulatory Visit (INDEPENDENT_AMBULATORY_CARE_PROVIDER_SITE_OTHER): Payer: Medicaid Other | Admitting: Family Medicine

## 2015-04-05 VITALS — BP 124/84 | HR 82 | Temp 98.4°F | Wt 241.8 lb

## 2015-04-05 DIAGNOSIS — Z3403 Encounter for supervision of normal first pregnancy, third trimester: Secondary | ICD-10-CM | POA: Diagnosis present

## 2015-04-05 LAB — POCT URINALYSIS DIP (DEVICE)
BILIRUBIN URINE: NEGATIVE
Glucose, UA: NEGATIVE mg/dL
Hgb urine dipstick: NEGATIVE
Ketones, ur: NEGATIVE mg/dL
Nitrite: NEGATIVE
Protein, ur: 30 mg/dL — AB
Specific Gravity, Urine: 1.02 (ref 1.005–1.030)
Urobilinogen, UA: 0.2 mg/dL (ref 0.0–1.0)
pH: 6.5 (ref 5.0–8.0)

## 2015-04-05 NOTE — Progress Notes (Signed)
Pt report URI sx - has a lot of pressure in forehead and around nose. Pt has a hard "bump" on her inner Rt thigh.

## 2015-04-05 NOTE — Progress Notes (Signed)
Subjective:  Lisa Chang is a 25 y.o. G1P0000 at 687w3d being seen today for ongoing prenatal care.  She is currently monitored for the following issues for this low-risk pregnancy and has Supervision of normal first pregnancy on her problem list.  Patient reports no complaints.  Contractions: Not present. Vag. Bleeding: None.  Movement: Present. Denies leaking of fluid.   The following portions of the patient's history were reviewed and updated as appropriate: allergies, current medications, past family history, past medical history, past social history, past surgical history and problem list. Problem list updated.  Objective:   Filed Vitals:   04/05/15 1044  BP: 124/84  Pulse: 82  Temp: 98.4 F (36.9 C)  Weight: 241 lb 12.8 oz (109.68 kg)    Fetal Status: Fetal Heart Rate (bpm): 140 Fundal Height: 39 cm Movement: Present  Presentation: Vertex  General:  Alert, oriented and cooperative. Patient is in no acute distress.  Skin: Skin is warm and dry. No rash noted.   Cardiovascular: Normal heart rate noted  Respiratory: Normal respiratory effort, no problems with respiration noted  Abdomen: Soft, gravid, appropriate for gestational age. Pain/Pressure: Present     Pelvic: Vag. Bleeding: None     Cervical exam performed Dilation: 1 Effacement (%): 50 Station: -3  Extremities: Normal range of motion.  Edema: Trace  Mental Status: Normal mood and affect. Normal behavior. Normal judgment and thought content.   Urinalysis: Urine Protein: 1+ Urine Glucose: Negative  Assessment and Plan:  Pregnancy: G1P0000 at 237w3d  1. Encounter for supervision of normal first pregnancy in third trimester - swept membranes today, recommend again at next visit, cervix anterior - Reviewed labor precautions - recommended zyrtec and nasal saline washes for congestion/allergies.  - At next appt schedule postdates IOL (discussed IOl at 8221w0d vs 3138w5d and need for testing in last week)  Term labor  symptoms and general obstetric precautions including but not limited to vaginal bleeding, contractions, leaking of fluid and fetal movement were reviewed in detail with the patient. Please refer to After Visit Summary for other counseling recommendations.  Return in about 1 week (around 04/12/2015) for as scheduled.   Federico FlakeKimberly Niles Alisea Matte, MD

## 2015-04-05 NOTE — Patient Instructions (Signed)
Come to the MAU (maternity admission unit) for 1) Strong contractions every 2-3 minutes for at least 1 hour that do not go away when you drink water or take a warm shower. These contractions will be so strong all you can do is breath through them 2) Vaginal bleeding- anything more than spotting 3) Loss of fluid like you broke your water 4) Decreased movement of your baby  

## 2015-04-08 ENCOUNTER — Encounter (HOSPITAL_COMMUNITY): Payer: Self-pay

## 2015-04-08 ENCOUNTER — Inpatient Hospital Stay (HOSPITAL_COMMUNITY)
Admission: AD | Admit: 2015-04-08 | Discharge: 2015-04-11 | DRG: 775 | Disposition: A | Discharge status: Home / Self Care | Payer: Managed Care, Other (non HMO) | Source: Ambulatory Visit | Attending: Obstetrics and Gynecology | Admitting: Obstetrics and Gynecology

## 2015-04-08 ENCOUNTER — Telehealth: Payer: Self-pay | Admitting: *Deleted

## 2015-04-08 DIAGNOSIS — Z833 Family history of diabetes mellitus: Secondary | ICD-10-CM

## 2015-04-08 DIAGNOSIS — Z87891 Personal history of nicotine dependence: Secondary | ICD-10-CM | POA: Diagnosis not present

## 2015-04-08 DIAGNOSIS — Z825 Family history of asthma and other chronic lower respiratory diseases: Secondary | ICD-10-CM

## 2015-04-08 DIAGNOSIS — O4292 Full-term premature rupture of membranes, unspecified as to length of time between rupture and onset of labor: Principal | ICD-10-CM | POA: Diagnosis present

## 2015-04-08 DIAGNOSIS — O4290 Premature rupture of membranes, unspecified as to length of time between rupture and onset of labor, unspecified weeks of gestation: Secondary | ICD-10-CM

## 2015-04-08 DIAGNOSIS — Z3A39 39 weeks gestation of pregnancy: Secondary | ICD-10-CM | POA: Diagnosis not present

## 2015-04-08 LAB — CBC
HCT: 31.4 % — ABNORMAL LOW (ref 36.0–46.0)
Hemoglobin: 10.5 g/dL — ABNORMAL LOW (ref 12.0–15.0)
MCH: 27.3 pg (ref 26.0–34.0)
MCHC: 33.4 g/dL (ref 30.0–36.0)
MCV: 81.8 fL (ref 78.0–100.0)
PLATELETS: 258 10*3/uL (ref 150–400)
RBC: 3.84 MIL/uL — ABNORMAL LOW (ref 3.87–5.11)
RDW: 14 % (ref 11.5–15.5)
WBC: 10.7 10*3/uL — ABNORMAL HIGH (ref 4.0–10.5)

## 2015-04-08 LAB — TYPE AND SCREEN
ABO/RH(D): A POS
Antibody Screen: NEGATIVE

## 2015-04-08 LAB — POCT FERN TEST: POCT FERN TEST: POSITIVE

## 2015-04-08 MED ORDER — LIDOCAINE HCL (PF) 1 % IJ SOLN
30.0000 mL | INTRAMUSCULAR | Status: DC | PRN
  Filled 2015-04-08 (×2): qty 30

## 2015-04-08 MED ORDER — OXYCODONE-ACETAMINOPHEN 5-325 MG PO TABS: 1.0000 | ORAL_TABLET | ORAL | Status: DC | PRN

## 2015-04-08 MED ORDER — OXYCODONE-ACETAMINOPHEN 5-325 MG PO TABS: 2.0000 | ORAL_TABLET | ORAL | Status: DC | PRN

## 2015-04-08 MED ORDER — ZOLPIDEM TARTRATE 5 MG PO TABS: 5.0000 mg | ORAL_TABLET | Freq: Every evening | ORAL | Status: DC | PRN

## 2015-04-08 MED ORDER — MISOPROSTOL 25 MCG QUARTER TABLET
50.0000 ug | ORAL_TABLET | ORAL | Status: DC
Start: 2015-04-09 — End: 2015-04-09
  Administered 2015-04-08 – 2015-04-09 (×2): 50 ug via ORAL
  Filled 2015-04-08 (×2): qty 0.5
  Filled 2015-04-08: qty 1

## 2015-04-08 MED ORDER — LACTATED RINGERS IV SOLN
INTRAVENOUS | Status: DC
  Administered 2015-04-08 – 2015-04-09 (×3): via INTRAVENOUS

## 2015-04-08 MED ORDER — TERBUTALINE SULFATE 1 MG/ML IJ SOLN
0.2500 mg | Freq: Once | INTRAMUSCULAR | Status: DC | PRN
  Filled 2015-04-08: qty 1

## 2015-04-08 MED ORDER — LORATADINE 10 MG PO TABS
10.0000 mg | ORAL_TABLET | Freq: Every day | ORAL | Status: DC
  Filled 2015-04-08: qty 1

## 2015-04-08 MED ORDER — FENTANYL CITRATE (PF) 100 MCG/2ML IJ SOLN
100.0000 ug | INTRAMUSCULAR | Status: DC | PRN
  Administered 2015-04-09 (×2): 100 ug via INTRAVENOUS
  Filled 2015-04-08 (×2): qty 2

## 2015-04-08 MED ORDER — OXYTOCIN BOLUS FROM INFUSION
500.0000 mL | INTRAVENOUS | Status: DC
Start: 2015-04-08 — End: 2015-04-09
  Administered 2015-04-09: 500 mL via INTRAVENOUS

## 2015-04-08 MED ORDER — CITRIC ACID-SODIUM CITRATE 334-500 MG/5ML PO SOLN: 30.0000 mL | ORAL | Status: DC | PRN

## 2015-04-08 MED ORDER — ONDANSETRON HCL 4 MG/2ML IJ SOLN
4.0000 mg | Freq: Four times a day (QID) | INTRAMUSCULAR | Status: DC | PRN
  Administered 2015-04-09: 4 mg via INTRAVENOUS
  Filled 2015-04-08: qty 2

## 2015-04-08 MED ORDER — ACETAMINOPHEN 325 MG PO TABS: 650.0000 mg | ORAL_TABLET | ORAL | Status: DC | PRN

## 2015-04-08 MED ORDER — OXYTOCIN 10 UNIT/ML IJ SOLN
2.5000 [IU]/h | INTRAVENOUS | Status: DC
  Administered 2015-04-09: 2.5 [IU]/h via INTRAVENOUS
  Filled 2015-04-08: qty 4

## 2015-04-08 MED ORDER — LACTATED RINGERS IV SOLN: 500.0000 mL | INTRAVENOUS | Status: DC | PRN

## 2015-04-08 NOTE — Telephone Encounter (Signed)
Patient called saying she has lost her mucous plug, what should she do? She is having no pain, vaginal bleeding or fluid leakage at this time. I provided reassurance that loss of the mucous plug is a general indicator that the body is preparing for labor but does not necessarily mean it is imminent. She should watch for contractions at least 5/hr, vaginal bleeding or fluid leak or gush. IIf any of these occur she should [facility] to get [facility]. Patient voiced understanding.

## 2015-04-08 NOTE — H&P (Signed)
Charlett NoseLianaivette [place] is a 25 y.o. female G1 @ 39.6wks by LMP and confirmed by 12wk scan presenting for leaking fluid since 1945 tonight. Rare ctx; no bldg. Reports +FM. Her [facility] and has been essentially unremarkable.  History OB History    Gravida Para Term Preterm AB TAB SAB Ectopic Multiple Living   1 0 0 0 0 0 0 0 0 0      Past Medical History  Diagnosis Date  . Herniated disc, cervical     sees chiropractor   Past Surgical History  Procedure Laterality Date  . No past surgeries     Family History: family history includes Asthma in her mother; Diabetes in her maternal aunt, maternal grandfather, maternal grandmother, and paternal grandfather. Social History:  reports that she has quit smoking. She has never used smokeless tobacco. She reports that she does not drink alcohol or use illicit drugs.   Prenatal Transfer Tool  Maternal Diabetes: No Genetic Screening: Normal Maternal Ultrasounds/Referrals: Normal Fetal Ultrasounds or other Referrals:  None Maternal Substance Abuse:  No Significant Maternal Medications:  None Significant Maternal Lab Results:  Lab values include: Group B Strep negative Other Comments:  None  ROS    Blood pressure 117/64, pulse 114, temperature 98.7 F (37.1 C), temperature source Oral, resp. rate 18, last menstrual period 07/03/2014. Exam Physical Exam  Constitutional: She is oriented to person, place, and time. She appears well-developed.  HENT:  Head: Normocephalic.  Neck: Normal range of motion.  Cardiovascular:  Sl tachycardic  Respiratory: Effort normal.  GI:  EFM 125-135, +accels, no decels No ctx per toco  Genitourinary:  SE: +pool, +fern Cx post 1/50/-2  Musculoskeletal: Normal range of motion.  Neurological: She is alert and oriented to person, place, and time.  Skin: Skin is warm and dry.  Psychiatric: She has a normal mood and affect. Her behavior is  normal. Thought content normal.    Prenatal labs: ABO, Rh: A/Positive/-- (09/20 0000) Antibody: Negative (09/20 0000) Rubella: Immune (09/20 0000) RPR: NON REAC (01/09 1140)  HBsAg: Negative (09/20 0000)  HIV: NONREACTIVE (01/09 1140)  GBS:   NEG 03/15/15  Assessment/Plan: IUP@39 .6wks PROM GBS neg  - [facility], and also cx ripening using PO cytotec; advance to foley bulb/Pit as indicated - Anticipate SVD   SHAW, KIMBERLY CNM 04/08/2015, 9:53 PM

## 2015-04-08 NOTE — MAU Note (Signed)
At 7:45 pm, ? Water broke, clear.  Lost mucus plug this morning.  1cm at last appointment.  Baby moving well. Contractions none. No bleeding.

## 2015-04-09 ENCOUNTER — Encounter (HOSPITAL_COMMUNITY): Payer: Self-pay | Admitting: *Deleted

## 2015-04-09 DIAGNOSIS — Z3A39 39 weeks gestation of pregnancy: Secondary | ICD-10-CM

## 2015-04-09 LAB — ABO/RH: ABO/RH(D): A POS

## 2015-04-09 LAB — RPR: RPR: NONREACTIVE

## 2015-04-09 MED ORDER — PRENATAL MULTIVITAMIN CH
1.0000 | ORAL_TABLET | Freq: Every day | ORAL | Status: DC
  Administered 2015-04-09: 1 via ORAL
  Filled 2015-04-09: qty 1

## 2015-04-09 MED ORDER — WITCH HAZEL-GLYCERIN EX PADS: 1.0000 "application " | MEDICATED_PAD | CUTANEOUS | Status: DC | PRN

## 2015-04-09 MED ORDER — LANOLIN HYDROUS EX OINT: TOPICAL_OINTMENT | CUTANEOUS | Status: DC | PRN

## 2015-04-09 MED ORDER — COMPLETENATE 29-1 MG PO CHEW
1.0000 | CHEWABLE_TABLET | Freq: Every day | ORAL | Status: DC
  Administered 2015-04-10 – 2015-04-11 (×2): 1 via ORAL
  Filled 2015-04-09 (×2): qty 1

## 2015-04-09 MED ORDER — BENZOCAINE-MENTHOL 20-0.5 % EX AERO: 1.0000 "application " | INHALATION_SPRAY | CUTANEOUS | Status: DC | PRN

## 2015-04-09 MED ORDER — IBUPROFEN 600 MG PO TABS
600.0000 mg | ORAL_TABLET | Freq: Four times a day (QID) | ORAL | Status: DC
  Administered 2015-04-09: 600 mg via ORAL
  Filled 2015-04-09: qty 1

## 2015-04-09 MED ORDER — DIBUCAINE 1 % RE OINT: 1.0000 "application " | TOPICAL_OINTMENT | RECTAL | Status: DC | PRN

## 2015-04-09 MED ORDER — SENNOSIDES-DOCUSATE SODIUM 8.6-50 MG PO TABS
2.0000 | ORAL_TABLET | ORAL | Status: DC
  Administered 2015-04-11: 2 via ORAL
  Filled 2015-04-09 (×2): qty 2

## 2015-04-09 MED ORDER — ACETAMINOPHEN 325 MG PO TABS: 650.0000 mg | ORAL_TABLET | ORAL | Status: DC | PRN

## 2015-04-09 MED ORDER — DIPHENHYDRAMINE HCL 25 MG PO CAPS: 25.0000 mg | ORAL_CAPSULE | Freq: Four times a day (QID) | ORAL | Status: DC | PRN

## 2015-04-09 MED ORDER — TETANUS-DIPHTH-ACELL PERTUSSIS 5-2.5-18.5 LF-MCG/0.5 IM SUSP: 0.5000 mL | Freq: Once | INTRAMUSCULAR | Status: DC

## 2015-04-09 MED ORDER — ONDANSETRON HCL 4 MG/2ML IJ SOLN: 4.0000 mg | INTRAMUSCULAR | Status: DC | PRN

## 2015-04-09 MED ORDER — ONDANSETRON HCL 4 MG PO TABS: 4.0000 mg | ORAL_TABLET | ORAL | Status: DC | PRN

## 2015-04-09 MED ORDER — SIMETHICONE 80 MG PO CHEW: 80.0000 mg | CHEWABLE_TABLET | ORAL | Status: DC | PRN

## 2015-04-09 MED ORDER — ZOLPIDEM TARTRATE 5 MG PO TABS
5.0000 mg | ORAL_TABLET | Freq: Every evening | ORAL | Status: DC | PRN
Start: 2015-04-09 — End: 2015-04-11

## 2015-04-09 MED ORDER — IBUPROFEN 100 MG/5ML PO SUSP
600.0000 mg | Freq: Four times a day (QID) | ORAL | Status: DC
  Administered 2015-04-09 – 2015-04-11 (×8): 600 mg via ORAL
  Filled 2015-04-09 (×8): qty 30

## 2015-04-09 NOTE — Consults (Signed)
  Anesthesia Pain Consult Note  Patient: Lisa Chang, 25 y.o., female  Consult Requested by: Catalina AntiguaPeggy Constant, MD  Reason for Consult: CRNA Pain round  Level of Consciousness: alert  Pain: 1 /10 Pain Goal:10  Last Vitals:  Filed Vitals:   04/08/15 2226 04/09/15 0024  BP: 132/76 131/75  Pulse: 75 78  Temp: 36.4 C 36.3 C  Resp: 18 18    Plan:Pt desires natural childbirth but was open to discussion about Epidural infusion for pain control.    Navi Ewton 04/09/2015

## 2015-04-09 NOTE — Lactation Note (Signed)
This note was copied from a baby's chart. Lactation Consultation Note  Patient Name: Lisa Chang Today's Date: 04/09/2015 Reason for consult: Initial assessment;Difficult latch   Initial consult with first time BF mom of 13 hour old infant. Infant with 6 BF for 10-60 minutes, 2 spoon feeds of 3 and 5 cc, 4 voids and 1 stool since birth. LATCH Scores 6 by bedside RN.   INfant was awake and cueing to feed. Mom did well with positioning infant to football and cross cradle holds. We used a # 24 NS to left breast, infant would not stay latched and kept pushing NS out of mouth. We tried #20 NS. More pain for mom noted and infant did not stay on the small NS either. We attempted to latch to both breasts without NS without success.   Mom with large compressible breasts. Nipple are flat and inverted in center. Areola tissue is thick and not easily compressible. Mom is able to apply NS, she reports sometimes she has trouble getting it to stay on, advised her to try pumping for a few minutes prior to applying to see if that will help. She has a DEBP at bedside and has been pumping. Inverted Breast Shells given with instructions for use.   Plan: Feed infant at breast 8-12 x in 24 hours at first feeding cues using NS with feeding, prepump to evert nipples Pump post BF for 15 minutes on Initiate setting Hand Express post pumping All EBM should be fed to infant in NS or with spoon  LC Brochure and BF Resources Handout given to parents. Informed of IP/OP Services, BF Support Groups and LC Phone #. Enc parents to call out to desk for assistance as needed. Parents voiced understanding to plan.    Maternal Data Formula Feeding for Exclusion: No Has patient been taught Hand Expression?: Yes Does the patient have breastfeeding experience prior to this delivery?: No  Feeding Feeding Type: Breast Fed Length of feed: 10 min  LATCH Score/Interventions Latch: Repeated attempts needed to sustain  latch, nipple held in mouth throughout feeding, stimulation needed to elicit sucking reflex. Intervention(s): Adjust position;Assist with latch;Breast massage;Breast compression  Audible Swallowing: None Intervention(s): Hand expression Intervention(s): Skin to skin;Hand expression;Alternate breast massage  Type of Nipple: Flat Intervention(s): Shells  Comfort (Breast/Nipple): Filling, red/small blisters or bruises, mild/mod discomfort  Problem noted: Mild/Moderate discomfort  Hold (Positioning): Assistance needed to correctly position infant at breast and maintain latch. Intervention(s): Breastfeeding basics reviewed;Support Pillows;Position options;Skin to skin  LATCH Score: 4  Lactation Tools Discussed/Used Tools: Nipple Shields Nipple shield size: 24 Breast pump type: Double-Electric Breast Pump Pump Review: Setup, frequency, and cleaning;Milk Storage   Consult Status Consult Status: Follow-up Date: 04/10/15 Follow-up type: In-patient    Lisa Chang 04/09/2015, 10:12 PM

## 2015-04-09 NOTE — Progress Notes (Signed)
Patient ID: Lisa Chang, female   DOB: 03/16/1990, 25 y.o.   MRN: [id number]  Feeling some cramping more.   FHR 120-130s, +accels, no decels Ctx irreg Cx deferred  SROM@term   Given cytotec 50mcg PO x second dose Will reeval in 4 hrs or sooner prn to determine plan of care  SHAW, [facility] 04/09/2015 4:20 AM

## 2015-04-10 NOTE — Progress Notes (Signed)
Post Partum Day 1 Subjective:  Lisa Chang is a 25 y.o. G1P1001 2761w0d s/p SVD.  No acute events overnight.  Pt denies problems with ambulating, voiding or po intake.  She denies nausea or vomiting.  Pain is well controlled.  She is unsure of flatus. She has had bowel movement.  Lochia Moderate.  Plan for birth control is Nexplanon.  Method of Feeding: Breast  Objective: Blood pressure 113/83, pulse 70, temperature 98 F (36.7 C), temperature source Oral, resp. rate 18, height 5\' 7"  (1.702 m), weight 244 lb 8 oz (110.904 kg), last menstrual period 07/03/2014, SpO2 100 %, unknown if currently breastfeeding.  Physical Exam:  General: alert, cooperative and no distress Lochia:normal flow Chest: CTAB, normal WOB on room air Heart: RRR no m/r/g Abdomen: +BS, soft, nontender,  Uterine Fundus: firm, below level of umbilicus DVT Evaluation: No evidence of DVT seen on physical exam. Extremities: no edema   Recent Labs  04/08/15 2200  HGB 10.5*  HCT 31.4*    Assessment/Plan:  ASSESSMENT: Charlett NoseLianaivette [place] Chang is a 25 y.o. G1P1001 261w0d s/p SVD  Plan for discharge tomorrow and Breastfeeding. Continue to work with lactation.  Does wish for inpatient circumcision.  Has [employer].   LOS: 2 days   Delynn FlavinAshly Zackaria Burkey, DO PGY-2, [facility] 04/10/2015, 6:15 AM

## 2015-04-10 NOTE — Lactation Note (Signed)
This note was copied from a baby's chart. Lactation Consultation Note  Patient Name: Lisa Chang Today's Date: 04/10/2015 Reason for consult: Follow-up assessment;Breast/nipple pain Mom has been latching baby to left breast only today using #24 nipple shield due to severe nipple pain on right breast. Both nipples are now sore, red, excoriated. Mom was willing to allow LC to help with latch on right breast using #24 nipple shield. Baby has difficulty obtaining good depth, 24 nipple shield is large for baby but fits Mom the best. Did try #20 nipple shield but too painful as well, Mom reported PS=10 reducing to 8. Mom is able to pump without discomfort. Oral exam of baby, LC observed short posterior lingual frenulum and short labial frenulum. Baby can extend tongue to lower lip, lateral tongue mobility limited. Mom is now wanting to pump/bottle feed to let her nipples heal. Advised parents they will need to supplement as Mom not receiving more than few drops of colostrum with hand expression and none with pumping. Discussed ways to supplement other than bottle but parents prefer bottle. Demonstrated to FOB how to pace feed using bottle with slow flow nipple.  Advised MOm to pump every 3 hours for 15 minutes. Supplement baby every 3 hours or with feeding ques.  Supplemental guidelines reviewed with parents, advised to give 15-30 tonight increasing per hours of age per guidelines. [facility] and [facility] latching again, but at least make an OP appointment for assist later this week. Mom agreeable. Care for sore nipples reviewed, advised to apply EBM, Mom has comfort gels and breast shells.    Maternal Data    Feeding Feeding Type: Formula Nipple Type: Slow - flow Length of feed: 15 min (off/on)  LATCH Score/Interventions Latch: Repeated attempts needed to sustain latch, nipple held in mouth throughout feeding, stimulation needed to elicit sucking  reflex. Intervention(s): Adjust position;Assist with latch;Breast massage;Breast compression  Audible Swallowing: None  Type of Nipple: Flat  Comfort (Breast/Nipple): Engorged, cracked, bleeding, large blisters, severe discomfort  Problem noted: Severe discomfort Interventions  (Cracked/bleeding/bruising/blister): Expressed breast milk to nipple Interventions (Mild/moderate discomfort): Post-pump;Comfort gels  Hold (Positioning): Assistance needed to correctly position infant at breast and maintain latch. Intervention(s): Breastfeeding basics reviewed;Support Pillows;Position options;Skin to skin  LATCH Score: 3  Lactation Tools Discussed/Used Tools: Shells;Nipple Shields;Pump;Comfort gels Nipple shield size: 20;24 Shell Type: Inverted Breast pump type: Double-Electric Breast Pump   Consult Status Consult Status: Follow-up Date: 04/11/15 Follow-up type: In-patient    Alfred LevinsGranger, Enza Shone Ann 04/10/2015, 8:14 PM

## 2015-04-11 MED ORDER — IBUPROFEN 100 MG/5ML PO SUSP
600.0000 mg | Freq: Four times a day (QID) | ORAL | Status: DC
Start: 2015-04-11 — End: 2015-05-21

## 2015-04-11 NOTE — Discharge Instructions (Signed)

## 2015-04-11 NOTE — Lactation Note (Signed)
This note was copied from a baby's chart. Lactation Consultation Note  Mother wearing shells and nipple soreness has improved. Mother has comfort gels. Suggest calling LC with next feeding to assist with latch. Nipples pink with abrasions on tips. Mother has been pumping up to 20 ml every 3 hours.  Supplementing the difference w/ formula. Reviewed volulme guidelines. Mother has personal DEBP at home. Reviewed engorgement care and monitoring voids/stools.   Patient Name: Lisa Chang Today's Date: 04/11/2015     Maternal Data    Feeding Feeding Type: Bottle Fed - Breast Milk Nipple Type: Slow - flow  LATCH Score/Interventions                      Lactation Tools Discussed/Used     Consult Status      Lisa Chang, Lisa Chang 04/11/2015, 8:41 AM

## 2015-04-11 NOTE — Discharge Summary (Signed)
OB Discharge Summary     Patient Name: Lisa Chang DOB: [birth year] MRN: [id number]  Date of admission: 04/08/2015 Delivering MD: Cam Hai D   Date of discharge: 04/11/2015  Admitting diagnosis: 39w water broke, called told me to come in Intrauterine pregnancy: [redacted]w[redacted]d     Secondary diagnosis:  Active Problems:   Amniotic fluid leaking  Additional problems: none     Discharge diagnosis: Term Pregnancy Delivered                                                                                                Post partum procedures:none  Augmentation: none  Complications: None  Hospital course:  Onset of Labor With Vaginal Delivery     25 y.o. yo G1P1001 at [redacted]w[redacted]d was admitted in Latent Labor on 04/08/2015. Patient had an uncomplicated labor course as follows:  Membrane Rupture Time/Date: 7:45 PM ,04/08/2015   Intrapartum Procedures: Episiotomy: None [1]                                         Lacerations:  None [1]  Patient had a delivery of a Viable infant. 04/09/2015  Information for the patient's newborn:  [place] [[id number]]  Delivery Method: Vaginal, Spontaneous Delivery (Filed from Delivery Summary)    Pateint had an uncomplicated postpartum course.  She is ambulating, tolerating a regular diet, passing flatus, and urinating well. Patient is discharged home in stable condition on 04/11/2015.  Physical exam  Filed Vitals:   04/10/15 0115 04/10/15 0620 04/10/15 1722 04/11/15 0555  BP: 113/83 110/67 135/78 139/73  Pulse: 70 69 77 80  Temp: 98 F (36.7 C) 97.8 F (36.6 C) 98.2 F (36.8 C) 98 F (36.7 C)  TempSrc: Oral Oral Oral Oral  Resp: Height:      Weight:      SpO2: 100% 99%     General: alert, cooperative and no distress Lochia: appropriate Uterine Fundus: firm Incision: N/A DVT Evaluation: No evidence of DVT seen on physical exam. Negative Homan's sign. No cords or calf tenderness. No significant calf/ankle  edema. Labs: Lab Results  Component Value Date   WBC 10.7* 04/08/2015   HGB 10.5* 04/08/2015   HCT 31.4* 04/08/2015   MCV 81.8 04/08/2015   PLT 258 04/08/2015   No flowsheet data found.  Discharge instruction: per After Visit Summary and "Baby and Me Booklet".  After visit meds:    Medication List    TAKE these medications        calcium carbonate 500 MG chewable tablet  Commonly known as:  TUMS - dosed in mg elemental calcium  Chew 2 tablets by mouth 3 (three) times daily as needed for indigestion or heartburn.     cetirizine 10 MG tablet  Commonly known as:  ZYRTEC  Take 10 mg by mouth daily.     guaifenesin 100 MG/5ML syrup  Commonly known as:  ROBITUSSIN  Take 200 mg by mouth 3 (three) times daily as  needed for cough or congestion.     ibuprofen 100 MG/5ML suspension  Commonly known as:  ADVIL,MOTRIN  Take 30 mLs (600 mg total) by mouth every 6 (six) hours.     OMEGA 3 PO  Take 1 capsule by mouth daily.     prenatal multivitamin Tabs tablet  Take 1 tablet by mouth daily at 12 noon.        Diet: routine diet  Activity: Advance as tolerated. Pelvic rest for 6 weeks.   Outpatient follow up:6 weeks Follow up Appt: Future Appointments Date Time Provider Department Center  04/12/2015 9:00 AM [facility]   Follow up Visit:No Follow-up on file.  Postpartum contraception: Nexplanon  Newborn Data: Live born female  Birth Weight: 6 lb 11.6 oz (3050 g) APGAR: 9, 10  Baby Feeding: Breast Disposition:home with mother. Needs IP circ before discharge   04/11/2015 Delynn FlavinAshly Gottschalk, DO

## 2015-04-12 ENCOUNTER — Other Ambulatory Visit: Payer: 59

## 2015-04-12 ENCOUNTER — Encounter: Payer: 59 | Admitting: Advanced Practice Midwife

## 2015-04-14 ENCOUNTER — Telehealth (HOSPITAL_COMMUNITY): Payer: Self-pay | Admitting: Lactation Services

## 2015-04-14 NOTE — Telephone Encounter (Signed)
Mom called to schedule appointment to help with latch. Baby now 526 days old. Mom used a nipple shield in the hospital to help with latch but stopped BF because nipples became too sore. Mom went to bottle/formula feeding. After d/c home Mom started offering the breast again but reports baby is not obtaining/sustaining latch. She is pumping every 1 1/2-3 hours receiving a total of 30 ml. Baby is taking 60 ml with each feeding. OP appointment scheduled for tomorrow at 0900. Mom to continue to offer breast but if baby will not latch/suckle after 5-10 minutes then Mom to pump and supplement baby till she has appointment tomorrow and [facility] refine a feeding plan. Mom agreeable. Advised to continue to pump every 3 hours for 15 minutes to encourage milk production and protect milk supply.

## 2015-04-15 ENCOUNTER — Ambulatory Visit (HOSPITAL_COMMUNITY)
Admission: RE | Admit: 2015-04-15 | Discharge: 2015-04-15 | Disposition: A | Discharge status: Home / Self Care | Payer: Managed Care, Other (non HMO) | Source: Ambulatory Visit | Attending: Obstetrics & Gynecology | Admitting: Obstetrics & Gynecology

## 2015-04-15 NOTE — Lactation Note (Signed)
Lactation Consultation for Malaisha [place] (mother) and Carolyne LittlesBennett P. Glindmyer (DOB: [birth year])  Consult:  Initial Lactation Consultant:  Remigio Eisenmengerichey, Dasja Brase Hamilton  ________________________________________________________________________ BW: 3050g (6# 11.6oz) D/c wt: 1610R2855g (6# 4.7oz, down 6.4%) 04-14-15: 7# 0oz Today's wt: 6# 14.7oz  ________________________________________________________________________  Mother's Name: Irene LimboLianaivette Krantz Type of delivery:  Vag Breastfeeding Experience:  primip Maternal Medical Conditions:  Obesity Maternal Medications:  PNV, Tylenol/IB ________________________________________________________________________  Breastfeeding History (Post Discharge)  Frequency of breastfeeding: breast is offered, but does not really latch (Mom has not been using the nipple shield at home).   Pumping  Type of pump:  Medela pump in style Frequency: q3h Volume: 2.5 oz/session  Bottle:  Avent: 3 oz q3hrs  (EBM & formula). Mom does not mix together.  Formula: Alimentum  Infant Intake and Output Assessment  Voids: 4+ in 24 hrs.  Color:  Clear yellow Stools: at least 3 in 24 hrs.  Color:  Yellow  ________________________________________________________________________  _______________________________________________________________________ Feeding Assessment/Evaluation  Initial feeding assessment:  Infant's oral assessment:  WNL  Tools:  Nipple shield 24 mm Instructed on use and cleaning of tool:  Yes.    Pre-feed weight: 3138 g   Post-feed weight: N/A Amount transferred: 0ml  Amount supplemented: 105 ml  Total supplement given: 105 ml (formula via bottle). He spat up a medium-sized amount after the feeding, but was still content.   Infant is 826 days old and is 3 oz above BW. Mom has been trying to latch Bennett to bare breast when they are at home, but he will not latch (Mom's nipples are almost flat & Willeen CassBennett has been primarily bottle-fed).    Bennett did not successfully latch during the consult. In summary, he was too frantic to latch & would not do so despite calming him w/formula in a bottle (and prefilling nipple shield w/EBM) multiple times. After multiple tries, we proceeded to finish feeding Bennett via bottle.  To get Willeen CassBennett to latch to the breast & maintain latch, Mom would need to use a nipple shield and supplement at the breast. However, Willeen CassBennett becomes agitated very quickly & Mom does not feel that she would have the time to apply the nipple shield, SNS, etc & be ready for the latch. I agree w/her assessment.   Mom is fine w/bottle-feeding, but she would prefer that it be w/EBM only. I talked w/Mom about supply & demand (sometimes Mom lets 8 hrs go between expressing her milk). Mom admits that until yesterday, she has been understimulating her breasts. For now the goal is to increase Mom's supply. I am confident that she can do so.  Plan:  1. Pump q3h for 15 min. Mom uses the size 27 flange at home, which is likely the correct size for her. 2. Write down the amounts of formula & breast milk Bennett receives/day. If she begins pumping consistently, she will likely begin noticing a difference over the next few days. 3. Mom is welcome to look up recipes for lactation cookies, but Mom understands that milk expression is the key to building supply.   Per Mom, Bennett's state of agitation during the consult is typical for him when he is not sleeping. He may benefit from some infant massage/craniosacral therapy. I gave Mom names of 2 people that may be of help Kathryne Eriksson-Erin Balkind, [phone] and -Denice Paradisearon [place], [phone]  Mom is welcome to return as desired.  Glenetta HewKim Francess Mullen, RN, IBCLC

## 2015-05-11 ENCOUNTER — Ambulatory Visit (INDEPENDENT_AMBULATORY_CARE_PROVIDER_SITE_OTHER): Payer: Managed Care, Other (non HMO) | Admitting: Obstetrics & Gynecology

## 2015-05-11 ENCOUNTER — Encounter: Payer: Self-pay | Admitting: Obstetrics & Gynecology

## 2015-05-11 VITALS — BP 117/76 | HR 69 | Ht 67.0 in | Wt 229.0 lb

## 2015-05-11 DIAGNOSIS — Z3202 Encounter for pregnancy test, result negative: Secondary | ICD-10-CM | POA: Diagnosis not present

## 2015-05-11 DIAGNOSIS — Z30017 Encounter for initial prescription of implantable subdermal contraceptive: Secondary | ICD-10-CM

## 2015-05-11 LAB — POCT PREGNANCY, URINE: PREG TEST UR: NEGATIVE

## 2015-05-11 MED ORDER — ETONOGESTREL 68 MG ~~LOC~~ IMPL
68.0000 mg | DRUG_IMPLANT | Freq: Once | SUBCUTANEOUS | Status: AC
  Administered 2015-05-11: 68 mg via SUBCUTANEOUS

## 2015-05-11 NOTE — Patient Instructions (Signed)
Etonogestrel implant What is this medicine? ETONOGESTREL (et oh noe JES trel) is a contraceptive (birth control) device. It is used to prevent pregnancy. It can be used for up to 3 years. This medicine may be used for other purposes; ask your health care provider or pharmacist if you have questions. What should I tell my health care provider before I take this medicine? They need to know if you have any of these conditions: -abnormal vaginal bleeding -blood vessel disease or blood clots -cancer of the breast, cervix, or liver -depression -diabetes -gallbladder disease -headaches -heart disease or recent heart attack -high blood pressure -high cholesterol -kidney disease -liver disease -renal disease -seizures -tobacco smoker -an unusual or allergic reaction to etonogestrel, other hormones, anesthetics or antiseptics, medicines, foods, dyes, or preservatives -pregnant or trying to get pregnant -breast-feeding How should I use this medicine? This device is inserted just under the skin on the inner side of your upper arm by a health care professional. Talk to your pediatrician regarding the use of this medicine in children. Special care may be needed. Overdosage: If you think you have taken too much of this medicine contact a poison control center or emergency room at once. NOTE: This medicine is only for you. Do not share this medicine with others. What if I miss a dose? This does not apply. What may interact with this medicine? Do not take this medicine with any of the following medications: -amprenavir -bosentan -fosamprenavir This medicine may also interact with the following medications: -barbiturate medicines for inducing sleep or treating seizures -certain medicines for fungal infections like ketoconazole and itraconazole -griseofulvin -medicines to treat seizures like carbamazepine, felbamate, oxcarbazepine, phenytoin,  topiramate -modafinil -phenylbutazone -rifampin -some medicines to treat HIV infection like atazanavir, indinavir, lopinavir, nelfinavir, tipranavir, ritonavir -St. John's wort This list may not describe all possible interactions. Give your health care provider a list of all the medicines, herbs, non-prescription drugs, or dietary supplements you use. Also tell them if you smoke, drink alcohol, or use illegal drugs. Some items may interact with your medicine. What should I watch for while using this medicine? This product does not protect you against HIV infection (AIDS) or other sexually transmitted diseases. You should be able to feel the implant by pressing your fingertips over the skin where it was inserted. Contact your doctor if you cannot feel the implant, and use a non-hormonal birth control method (such as condoms) until your doctor confirms that the implant is in place. If you feel that the implant may have broken or become bent while in your arm, contact your healthcare provider. What side effects may I notice from receiving this medicine? Side effects that you should report to your doctor or health care professional as soon as possible: -allergic reactions like skin rash, itching or hives, swelling of the face, lips, or tongue -breast lumps -changes in emotions or moods -depressed mood -heavy or prolonged menstrual bleeding -pain, irritation, swelling, or bruising at the insertion site -scar at site of insertion -signs of infection at the insertion site such as fever, and skin redness, pain or discharge -signs of pregnancy -signs and symptoms of a blood clot such as breathing problems; changes in vision; chest pain; severe, sudden headache; pain, swelling, warmth in the leg; trouble speaking; sudden numbness or weakness of the face, arm or leg -signs and symptoms of liver injury like dark yellow or brown urine; general ill feeling or flu-like symptoms; light-colored stools; loss of  appetite; nausea; right upper belly   pain; unusually weak or tired; yellowing of the eyes or skin -unusual vaginal bleeding, discharge -signs and symptoms of a stroke like changes in vision; confusion; trouble speaking or understanding; severe headaches; sudden numbness or weakness of the face, arm or leg; trouble walking; dizziness; loss of balance or coordination Side effects that usually do not require medical attention (Report these to your doctor or health care professional if they continue or are bothersome.): -acne -back pain -breast pain -changes in weight -dizziness -general ill feeling or flu-like symptoms -headache -irregular menstrual bleeding -nausea -sore throat -vaginal irritation or inflammation This list may not describe all possible side effects. Call your doctor for medical advice about side effects. You may report side effects to FDA at 1-800-FDA-1088. Where should I keep my medicine? This drug is given in a hospital or clinic and will not be stored at home. NOTE: This sheet is a summary. It may not cover all possible information. If you have questions about this medicine, talk to your doctor, pharmacist, or health care provider.    2016, Elsevier/Gold Standard. (2013-10-09 14:07:06)  

## 2015-05-11 NOTE — Progress Notes (Signed)
Subjective:     Lisa Chang is a 25 y.o. female who presents for a postpartum visit. She is 4 weeks postpartum following a spontaneous vaginal delivery. I have fully reviewed the prenatal and intrapartum course. The delivery was at 40 gestational weeks. Outcome: spontaneous vaginal delivery. Anesthesia: none. Postpartum course has been good. Baby's course has been normal. Baby is feeding by breast. Bleeding no bleeding. Bowel function is normal. Bladder function is normal. Patient is sexually active. Contraception method is condoms and Nexplanon. Postpartum depression screening: negative. She used condoms, last intercourse was 1 week ago and has bleeding now  The following portions of the patient's history were reviewed and updated as appropriate: allergies, current medications, past family history, past medical history, past social history, past surgical history and problem list.  Review of Systems Pertinent items are noted in HPI.   Objective:    BP 117/76 mmHg  Pulse 69  Ht 5\' 7"  (1.702 m)  Wt 229 lb (103.874 kg)  BMI 35.86 kg/m2  Breastfeeding? Yes  General:  alert, cooperative and no distress   Breasts:     Lungs: effort normal  Heart:  regular rate and rhythm  Abdomen: soft, non-tender; bowel sounds normal; no masses,  no organomegaly   Vulva:  not evaluated  Vagina: not evaluated                   Patient given informed consent, signed copy in the chart, time out was performed. Pregnancy test was neg Appropriate time out taken.  Patient's left arm was prepped and draped in the usual sterile fashion.. The ruler used to measure and mark insertion area.  Pt was prepped with alcohol swab and then injected with 3 cc of 1 % lidocaine.  Pt was prepped with betadine, Nexplanon removed form packaging. Then inserted per standard guidelines. Patient and provider were able to palpate rod under skin. Pt insertion site covered with sterile dressing.   Minimal blood loss.  Pt tolerated  the procedure well.  Assessment:     normal postpartum exam. Pap smear not done at today's visit.   Plan:    1. Contraception: Nexplanon 2. Placed today 3. Follow up as needed.    Adam PhenixJames G Arnold, MD

## 2015-05-17 ENCOUNTER — Telehealth: Payer: Self-pay | Admitting: *Deleted

## 2015-05-17 NOTE — Telephone Encounter (Signed)
Patient left  A voice message yesterday am stating she was in the clinic on the 3rd and she got the birth control in her arm.  States she has been having bad cramps for 3 days, pain in her hips and knees, nausea. States not sure if this is  Normal or is she needs to come to the hospital.  Called pt. And left a message I am returning hr call- please call the clinic.

## 2015-05-19 NOTE — Telephone Encounter (Signed)
Pt. Called back and call transferred to nurse. She states she is having cramps=7-8, nausea , pain and motrin 200mg  not helping much.  I advised her to take home pregnancy test just to make sure is still negative.  Then may increase motrin to 600 mg every 6 hours as needed - take with food and lots of fluids. We discussed as her body adjusts to nexplanon may have irregular bleeding, cramps, etc - but with time usually lessens. Call back if needed for persistent pain, etc. She voices understanding.

## 2015-05-21 ENCOUNTER — Inpatient Hospital Stay (HOSPITAL_COMMUNITY)
Admission: AD | Admit: 2015-05-21 | Discharge: 2015-05-21 | Disposition: A | Discharge status: Home / Self Care | Payer: Managed Care, Other (non HMO) | Source: Ambulatory Visit | Attending: Obstetrics & Gynecology | Admitting: Obstetrics & Gynecology

## 2015-05-21 ENCOUNTER — Encounter (HOSPITAL_COMMUNITY): Payer: Self-pay | Admitting: Certified Nurse Midwife

## 2015-05-21 ENCOUNTER — Inpatient Hospital Stay (HOSPITAL_COMMUNITY): Payer: Managed Care, Other (non HMO)

## 2015-05-21 DIAGNOSIS — N939 Abnormal uterine and vaginal bleeding, unspecified: Secondary | ICD-10-CM

## 2015-05-21 DIAGNOSIS — Z87891 Personal history of nicotine dependence: Secondary | ICD-10-CM | POA: Insufficient documentation

## 2015-05-21 DIAGNOSIS — R102 Pelvic and perineal pain: Secondary | ICD-10-CM

## 2015-05-21 LAB — CBC WITH DIFFERENTIAL/PLATELET
BASOS ABS: 0 10*3/uL (ref 0.0–0.1)
Basophils Relative: 0 %
EOS PCT: 0 %
Eosinophils Absolute: 0 10*3/uL (ref 0.0–0.7)
HCT: 34.2 % — ABNORMAL LOW (ref 36.0–46.0)
Hemoglobin: 11.3 g/dL — ABNORMAL LOW (ref 12.0–15.0)
LYMPHS ABS: 1.2 10*3/uL (ref 0.7–4.0)
LYMPHS PCT: 13 %
MCH: 26.9 pg (ref 26.0–34.0)
MCHC: 33 g/dL (ref 30.0–36.0)
MCV: 81.4 fL (ref 78.0–100.0)
MONO ABS: 0.6 10*3/uL (ref 0.1–1.0)
MONOS PCT: 7 %
Neutro Abs: 7.1 10*3/uL (ref 1.7–7.7)
Neutrophils Relative %: 80 %
PLATELETS: 291 10*3/uL (ref 150–400)
RBC: 4.2 MIL/uL (ref 3.87–5.11)
RDW: 14.9 % (ref 11.5–15.5)
WBC: 8.9 10*3/uL (ref 4.0–10.5)

## 2015-05-21 LAB — POCT PREGNANCY, URINE: Preg Test, Ur: NEGATIVE

## 2015-05-21 LAB — URINALYSIS, ROUTINE W REFLEX MICROSCOPIC
GLUCOSE, UA: 100 mg/dL — AB
Ketones, ur: NEGATIVE mg/dL
Nitrite: NEGATIVE
PH: 5.5 (ref 5.0–8.0)
PROTEIN: 100 mg/dL — AB
Specific Gravity, Urine: 1.015 (ref 1.005–1.030)

## 2015-05-21 LAB — URINE MICROSCOPIC-ADD ON

## 2015-05-21 MED ORDER — IBUPROFEN 200 MG PO TABS: 600.0000 mg | ORAL_TABLET | Freq: Four times a day (QID) | ORAL | Status: AC | PRN

## 2015-05-21 NOTE — MAU Provider Note (Signed)
MAU HISTORY AND PHYSICAL  Chief Complaint:  Vaginal Bleeding   Lisa Chang is a 25 y.o.  G1P1001 presenting for Vaginal Bleeding  NSVD 4/1. Bleeding for no more than 2 weeks, relatively light. Then on 5/1 began bleeding again, spotting.  Nexplanon placed 5/3, no bleeding the next day. Then starting on 5/5 began cramping. 5/8 began bleeding, currently going through 2-3 maternity pads a day. Painful cramping. No fevers. Discomfort with urination. Nauseaus a few hours ago. Sometimes loose stools. Last sex 2 weeks ago.    Past Medical History  Diagnosis Date  . Herniated disc, cervical     sees chiropractor    Past Surgical History  Procedure Laterality Date  . No past surgeries    . Wisdom tooth extraction      Family History  Problem Relation Age of Onset  . Asthma Mother   . Diabetes Maternal Aunt   . Diabetes Maternal Grandmother   . Diabetes Maternal Grandfather   . Diabetes Paternal Grandfather     Social History  Substance Use Topics  . Smoking status: Former Smoker -- 1 years  . Smokeless tobacco: Never Used  . Alcohol Use: No    No Known Allergies  Prescriptions prior to admission  Medication Sig Dispense Refill Last Dose  . calcium carbonate (TUMS - DOSED IN MG ELEMENTAL CALCIUM) 500 MG chewable tablet Chew 2 tablets by mouth 3 (three) times daily as needed for indigestion or heartburn.   Past Month at Unknown time  . ibuprofen (ADVIL,MOTRIN) 200 MG tablet Take 200 mg by mouth every 6 (six) hours as needed for headache or cramping.   05/21/2015 at Unknown time  . Omega-3 Fatty Acids (OMEGA 3 PO) Take 1 capsule by mouth daily.   Past Week at Unknown time  . Prenatal Vit-Fe Fumarate-FA (PRENATAL MULTIVITAMIN) TABS tablet Take 1 tablet by mouth daily at 12 noon.   Past Week at Unknown time  . ibuprofen (ADVIL,MOTRIN) 100 MG/5ML suspension Take 30 mLs (600 mg total) by mouth every 6 (six) hours. (Patient not taking: Reported on 05/21/2015) 240 mL 0 Not Taking  at Unknown time    Review of Systems - Negative except for what is mentioned in HPI.  Physical Exam  Blood pressure 113/69, pulse 91, temperature 99 F (37.2 C), temperature source Oral, resp. rate 18, last menstrual period 05/09/2015, currently breastfeeding. GENERAL: Well-developed, well-nourished female in moderate distress  LUNGS: Clear to auscultation bilaterally.  HEART: Regular rate and rhythm. ABDOMEN: Soft, diffuse mild ttp suprapubic and rlq, nondistended, .  EXTREMITIES: Nontender, no edema, 2+ distal pulses. GU: normal external genitalia, mild amount blood in vagina, normal cervix no purulence from closed os    Labs: Results for orders placed or performed during the hospital encounter of 05/21/15 (from the past 24 hour(s))  Urinalysis, Routine w reflex microscopic (not at [facility])   Collection Time: 05/21/15  3:00 PM  Result Value Ref Range   Color, Urine AMBER (A) YELLOW   APPearance CLOUDY (A) CLEAR   Specific Gravity, Urine 1.015 1.005 - 1.030   pH 5.5 5.0 - 8.0   Glucose, UA 100 (A) NEGATIVE mg/dL   Hgb urine dipstick LARGE (A) NEGATIVE   Bilirubin Urine SMALL (A) NEGATIVE   Ketones, ur NEGATIVE NEGATIVE mg/dL   Protein, ur 161100 (A) NEGATIVE mg/dL   Nitrite NEGATIVE NEGATIVE   Leukocytes, UA MODERATE (A) NEGATIVE  Urine microscopic-add on   Collection Time: 05/21/15  3:00 PM  Result Value Ref Range  Squamous Epithelial / LPF 0-5 (A) NONE SEEN   WBC, UA 6-30 0 - 5 WBC/hpf   RBC / HPF TOO NUMEROUS TO COUNT 0 - 5 RBC/hpf   Bacteria, UA MANY (A) NONE SEEN  CBC with Differential/Platelet   Collection Time: 05/21/15  3:09 PM  Result Value Ref Range   WBC 8.9 4.0 - 10.5 K/uL   RBC 4.20 3.87 - 5.11 MIL/uL   Hemoglobin 11.3 (L) 12.0 - 15.0 g/dL   HCT 16.1 (L) 09.6 - 04.5 %   MCV 81.4 78.0 - 100.0 fL   MCH 26.9 26.0 - 34.0 pg   MCHC 33.0 30.0 - 36.0 g/dL   RDW 40.9 81.1 - 91.4 %   Platelets 291 150 - 400 K/uL   Neutrophils Relative % 80 %   Neutro Abs 7.1 1.7 -  7.7 K/uL   Lymphocytes Relative 13 %   Lymphs Abs 1.2 0.7 - 4.0 K/uL   Monocytes Relative 7 %   Monocytes Absolute 0.6 0.1 - 1.0 K/uL   Eosinophils Relative 0 %   Eosinophils Absolute 0.0 0.0 - 0.7 K/uL   Basophils Relative 0 %   Basophils Absolute 0.0 0.0 - 0.1 K/uL  Pregnancy, urine POC   Collection Time: 05/21/15  3:24 PM  Result Value Ref Range   Preg Test, Ur NEGATIVE NEGATIVE    Imaging Studies:  US Transvaginal Non-ob  05/21/2015  CLINICAL DATA:  Pelvic pain and bleeding, 6 weeks postpartum, recent contraceptive placement EXAM: TRANSABDOMINAL AND TRANSVAGINAL ULTRASOUND OF PELVIS TECHNIQUE: Both transabdominal and transvaginal ultrasound examinations of the pelvis were performed. Transabdominal technique was performed for global imaging of the pelvis including uterus, ovaries, adnexal regions, and pelvic cul-de-sac. It was necessary to proceed with endovaginal exam following the transabdominal exam to visualize the ovaries. COMPARISON:  None FINDINGS: Uterus Measurements: 9.4 x 4.8 x 6.5 cm. No fibroids or other mass visualized. Endometrium Thickness: 10.3 mm. There is a focal area of increased echogenicity identified adjacent to the endometrium eccentric to the right. This may simply represent some irregularity of the endometrium. No definitive mass lesion is seen. No increased vascularity to suggest retained products of conception is noted. No other focal abnormality is seen. Right ovary Measurements: 3.4 x 1.8 x 4.0 cm. Normal appearance/no adnexal mass. Left ovary Measurements: 4.5 x 3.4 x 3.3 cm. Normal appearance/no adnexal mass. Other findings Moderate amount of echogenic free fluid is seen. This raises suspicion for hemorrhage. This could be related to a ruptured cyst. Clinical correlation is recommended. IMPRESSION: Irregular area of increased echogenicity adjacent to the endometrium as described. This is of un known etiology but does not appear to represent retained products of  conception. Echogenic free fluid within the pelvis suggestive of hemorrhage. This may be related to a ruptured cyst. Followup examination can be performed as clinically indicated. Electronically Signed   By: Alcide Clever M.D.   On: 05/21/2015 17:14   US Pelvis Complete  05/21/2015  CLINICAL DATA:  Pelvic pain and bleeding, 6 weeks postpartum, recent contraceptive placement EXAM: TRANSABDOMINAL AND TRANSVAGINAL ULTRASOUND OF PELVIS TECHNIQUE: Both transabdominal and transvaginal ultrasound examinations of the pelvis were performed. Transabdominal technique was performed for global imaging of the pelvis including uterus, ovaries, adnexal regions, and pelvic cul-de-sac. It was necessary to proceed with endovaginal exam following the transabdominal exam to visualize the ovaries. COMPARISON:  None FINDINGS: Uterus Measurements: 9.4 x 4.8 x 6.5 cm. No fibroids or other mass visualized. Endometrium Thickness: 10.3 mm. There is a focal  area of increased echogenicity identified adjacent to the endometrium eccentric to the right. This may simply represent some irregularity of the endometrium. No definitive mass lesion is seen. No increased vascularity to suggest retained products of conception is noted. No other focal abnormality is seen. Right ovary Measurements: 3.4 x 1.8 x 4.0 cm. Normal appearance/no adnexal mass. Left ovary Measurements: 4.5 x 3.4 x 3.3 cm. Normal appearance/no adnexal mass. Other findings Moderate amount of echogenic free fluid is seen. This raises suspicion for hemorrhage. This could be related to a ruptured cyst. Clinical correlation is recommended. IMPRESSION: Irregular area of increased echogenicity adjacent to the endometrium as described. This is of un known etiology but does not appear to represent retained products of conception. Echogenic free fluid within the pelvis suggestive of hemorrhage. This may be related to a ruptured cyst. Followup examination can be performed as clinically  indicated. Electronically Signed   By: Alcide Clever M.D.   On: 05/21/2015 17:14    Assessment/Plan: Lisa Chang is  25 y.o. G1P1001 presents with vaginal bleeding and abdominal pain. Think constellation of symptoms not entirely explained by recent nexplanon placement, which is the patient's supposition. U/s findings and physical exam not strongly suggestive of retained products. Bleeding is not very heavy and H is improved from hospital d/c 6 wks ago. ua not suggestive of infection and no elevated WBCs. [place] does show moderate free fluid, and given patient's pain is midline and right-sided is possible this represents sequelae from a ruptured ovarian cyst. Will treat with ibuprofen and I have counseled her on return precautions.    Cherrie Gauze [facility] 5/13/20176:09 PM

## 2015-05-21 NOTE — MAU Note (Signed)
Pt reports she started bleeding on 5/1. Had a normal period. Had explanon placed on 5/3 and stopped on th 5/4. Pt started having bad cramps on 5/5-5/6 and stared bleeding 5 days ago. Reports cramps are very painful like labor pain sometimes.

## 2015-05-21 NOTE — Discharge Instructions (Signed)
Menorrhagia °Menorrhagia is the medical term for when your menstrual periods are heavy or last longer than usual. With menorrhagia, every period you have may cause enough blood loss and cramping that you are unable to maintain your usual activities. °CAUSES  °In some cases, the cause of heavy periods is unknown, but a number of conditions may cause menorrhagia. Common causes include: °· A problem with the hormone-producing thyroid gland (hypothyroid). °· Noncancerous growths in the uterus (polyps or fibroids). °· An imbalance of the estrogen and progesterone hormones. °· One of your ovaries not releasing an egg during one or more months. °· Side effects of having an intrauterine device (IUD). °· Side effects of some medicines, such as anti-inflammatory medicines or blood thinners. °· A bleeding disorder that stops your blood from clotting normally. °SIGNS AND SYMPTOMS  °During a normal period, bleeding lasts between 4 and 8 days. Signs that your periods are too heavy include: °· You routinely have to change your pad or tampon every 1 or 2 hours because it is completely soaked. °· You pass blood clots larger than 1 inch (2.5 cm) in size. °· You have bleeding for more than 7 days. °· You need to use pads and tampons at the same time because of heavy bleeding. °· You need to wake up to change your pads or tampons during the night. °· You have symptoms of anemia, such as tiredness, fatigue, or shortness of breath.  °DIAGNOSIS  °Your health care provider will perform a physical exam and ask you questions about your symptoms and menstrual history. Other tests may be ordered based on what the health care provider finds during the exam. These tests can include: °· Blood tests. Blood tests are used to check if you are pregnant or have hormonal changes, a bleeding or thyroid disorder, low iron levels (anemia), or other problems. °· Endometrial biopsy. Your health care provider takes a sample of tissue from the inside of your  uterus to be examined under a microscope. °· Pelvic ultrasound. This test uses sound waves to make a picture of your uterus, ovaries, and vagina. The pictures can show if you have fibroids or other growths. °· Hysteroscopy. For this test, your health care provider will use a small telescope to look inside your uterus. °Based on the results of your initial tests, your health care provider may recommend further testing. °TREATMENT  °Treatment may not be needed. If it is needed, your health care provider may recommend treatment with one or more medicines first. If these do not reduce bleeding enough, a surgical treatment might be an option. The best treatment for you will depend on:  °· Whether you need to prevent pregnancy.   °· Your desire to have children in the future. °· The cause and severity of your bleeding. °· Your opinion and personal preference.   °Medicines for menorrhagia may include: °· Birth control methods that use hormones. These include the pill, skin patch, vaginal ring, shots that you get every 3 months, hormonal IUD, and implant. These treatments reduce bleeding during your menstrual period. °· Medicines that thicken blood and slow bleeding. °· Medicines that reduce swelling, such as ibuprofen.  °· Medicines that contain a synthetic hormone called progestin.   °· Medicines that make the ovaries stop working for a short time.   °You may need surgical treatment for menorrhagia if the medicines are unsuccessful. Treatment options include: °· Dilation and curettage (D&C). In this procedure, your health care provider opens (dilates) your cervix and then scrapes or suctions tissue from   the lining of your uterus to reduce menstrual bleeding.  Operative hysteroscopy. This procedure uses a tiny tube with a light (hysteroscope) to view your uterine cavity and can help in the surgical removal of a polyp that may be causing heavy periods.  Endometrial ablation. Through various techniques, your health care  provider permanently destroys the entire lining of your uterus (endometrium). After endometrial ablation, most women have little or no menstrual flow. Endometrial ablation reduces your ability to become pregnant.  Endometrial resection. This surgical procedure uses an electrosurgical wire loop to remove the lining of the uterus. This procedure also reduces your ability to become pregnant.  Hysterectomy. Surgical removal of the uterus and cervix is a permanent procedure that stops menstrual periods. Pregnancy is not possible after a hysterectomy. This procedure requires anesthesia and hospitalization. HOME CARE INSTRUCTIONS   Only take over-the-counter or prescription medicines as directed by your health care provider. Take prescribed medicines exactly as directed. Do not change or switch medicines without consulting your health care provider.  Take any prescribed iron pills exactly as directed by your health care provider. Long-term heavy bleeding may result in low iron levels. Iron pills help replace the iron your body lost from heavy bleeding. Iron may cause constipation. If this becomes a problem, increase the bran, fruits, and roughage in your diet.  Do not take aspirin or medicines that contain aspirin 1 week before or during your menstrual period. Aspirin may make the bleeding worse.  If you need to change your sanitary pad or tampon more than once every 2 hours, stay in bed and rest as much as possible until the bleeding stops.  Eat well-balanced meals. Eat foods high in iron. Examples are leafy green vegetables, meat, liver, eggs, and whole grain breads and cereals. Do not try to lose weight until the abnormal bleeding has stopped and your blood iron level is back to normal. SEEK MEDICAL CARE IF:   You soak through a pad or tampon every 1 or 2 hours, and this happens every time you have a period.  You need to use pads and tampons at the same time because you are bleeding so much.  You  need to change your pad or tampon during the night.  You have a period that lasts for more than 8 days.  You pass clots bigger than 1 inch wide.  You have irregular periods that happen more or less often than once a month.  You feel dizzy or faint.  You feel very weak or tired.  You feel short of breath or feel your heart is beating too fast when you exercise.  You have nausea and vomiting or diarrhea while you are taking your medicine.  You have any problems that may be related to the medicine you are taking. SEEK IMMEDIATE MEDICAL CARE IF:   You soak through 4 or more pads or tampons in 2 hours.  You have any bleeding while you are pregnant. MAKE SURE YOU:   Understand these instructions.  Will watch your condition.  Will get help right away if you are not doing well or get worse.   This information is not intended to replace advice given to you by your health care provider. Make sure you discuss any questions you have with your health care provider.   Document Released: 12/25/2004 Document Revised: 12/30/2012 Document Reviewed: 06/15/2012 Elsevier Interactive Patient Education 2016 Elsevier Inc. Pelvic Pain, Female Pelvic pain is pain felt below the belly button and between your hips.  It can be caused by many different things. It is important to get help right away. This is especially true for severe, sharp, or unusual pain that comes on suddenly.  HOME CARE  Only take medicine as told by your doctor.  Rest as told by your doctor.  Eat a healthy diet, such as fruits, vegetables, and lean meats.  Drink enough fluids to keep your pee (urine) clear or pale yellow, or as told.  Avoid sex (intercourse) if it causes pain.  Apply warm or cold packs to your lower belly (abdomen). Use the type of pack that helps the pain.  Avoid situations that cause you stress.  Keep a journal to track your pain. Write down:  When the pain started.  Where it is located.  If there  are things that seem to be related to the pain, such as food or your period.  Follow up with your doctor as told. GET HELP RIGHT AWAY IF:   You have heavy bleeding from the vagina.  You have more pelvic pain.  You feel lightheaded or pass out (faint).  You have chills.  You have pain when you pee or have blood in your pee.  You cannot stop having watery poop (diarrhea).  You cannot stop throwing up (vomiting).  You have a fever or lasting symptoms for more than 3 days.  You have a fever and your symptoms suddenly get worse.  You are being physically or sexually abused.  Your medicine does not help your pain.  You have fluid (discharge) coming from your vagina that is not normal. MAKE SURE YOU:  Understand these instructions.  Will watch your condition.  Will get help if you are not doing well or get worse.   This information is not intended to replace advice given to you by your health care provider. Make sure you discuss any questions you have with your health care provider.   Document Released: 06/13/2007 Document Revised: 01/15/2014 Document Reviewed: 04/16/2011 Elsevier Interactive Patient Education [employer] Else[employer] Inc[employer]

## 2015-05-22 LAB — URINE CULTURE: Special Requests: NORMAL

## 2015-05-23 ENCOUNTER — Telehealth: Payer: Self-pay | Admitting: *Deleted

## 2015-05-23 DIAGNOSIS — A749 Chlamydial infection, unspecified: Secondary | ICD-10-CM

## 2015-05-23 LAB — GC/CHLAMYDIA PROBE AMP (~~LOC~~) NOT AT ARMC
CHLAMYDIA, DNA PROBE: POSITIVE — AB
Neisseria Gonorrhea: NEGATIVE

## 2015-05-23 MED ORDER — AZITHROMYCIN 500 MG PO TABS: ORAL_TABLET | ORAL | Status: DC

## 2015-05-23 NOTE — Telephone Encounter (Signed)
Telephone call to patient regarding positive chlamydia culture, patient notified.  Rx routed to patients pharmacy with one refill for partners treatment per protocol.  No drug allergies verified for partner.  Instructed patient to complete treatment and abstain from sex for seven days post treatment.  Report faxed to health department.

## 2015-05-30 ENCOUNTER — Telehealth: Payer: Self-pay | Admitting: General Practice

## 2015-05-30 NOTE — Telephone Encounter (Signed)
Per Dr Ashok PallWouk, patient had recent MAU visit for bleeding & abdominal pain & was found to have chlamydia. Need to call patient to see how she is doing. If patient continues to have pain that does not improve, needs clinic f/u to assess for possible PID. Called patient, no answer- left message stating we are trying to reach you, please call us back at the clinics

## 2015-06-01 NOTE — Telephone Encounter (Signed)
Patient called and left message 5/23 @ 1038 that she is returning our phone call. Called patient stating I was calling to see how she is feeling and if her pain has improved. Patient states she is feeling better and is not having any pain. Patient had no questions

## 2016-04-19 IMAGING — US US MFM OB DETAIL+14 WK
3 series · 14 of 28 positions shown · non-contrast
Comparison: none

[Series 1: us mfm ob detail+14 wk · 64 acquisitions, 9 frames shown (1 of 3)]
[im 4/64]
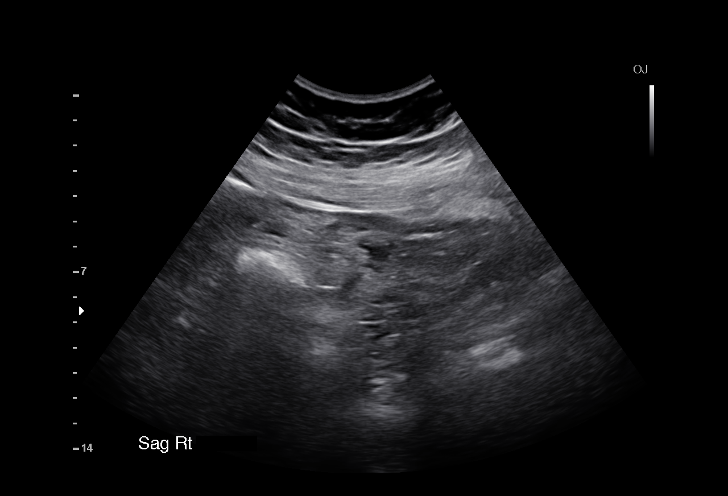
[im 11/64]
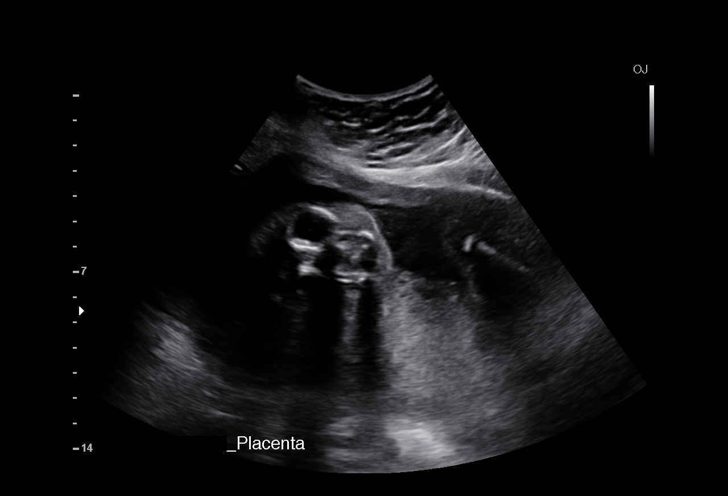
[im 18/64]
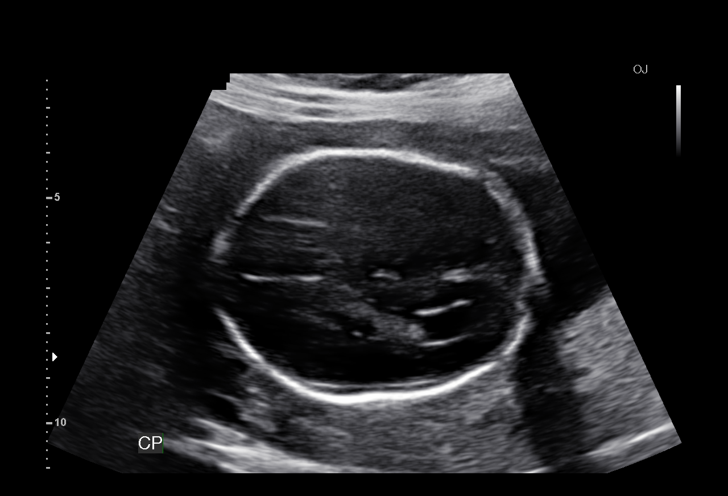
[im 25/64]
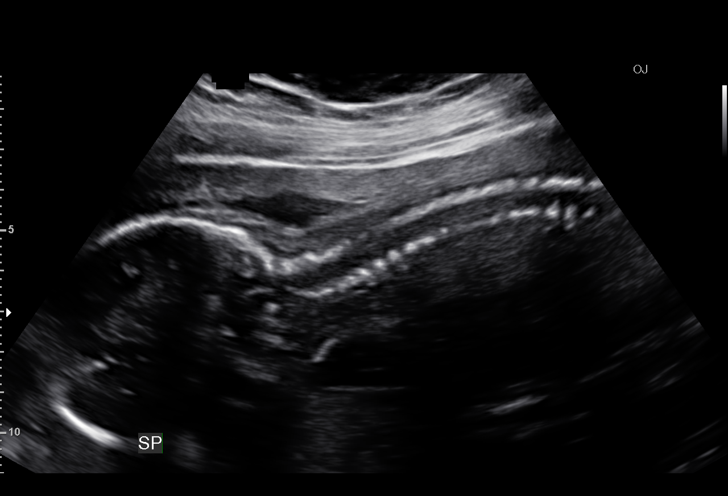
[im 32/64]
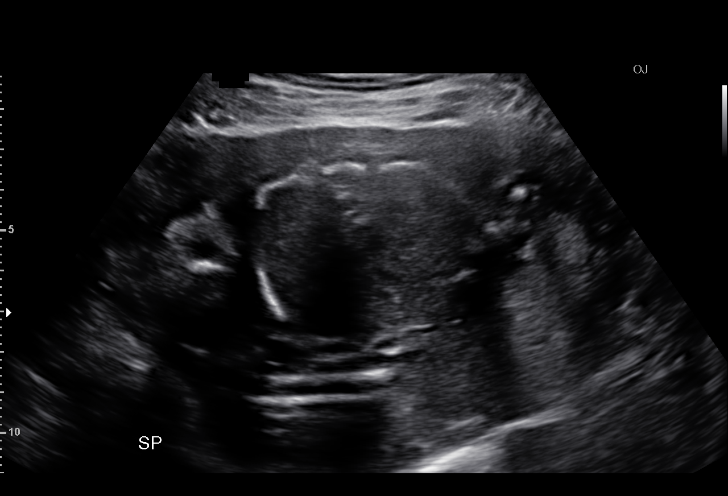
[im 39/64]
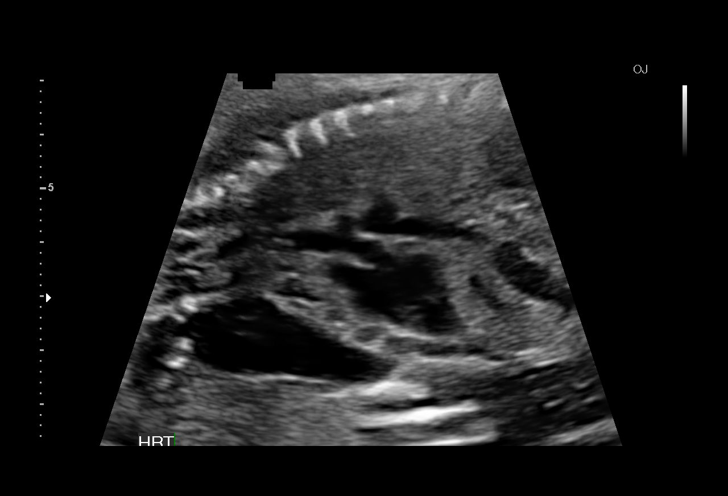
[im 46/64]
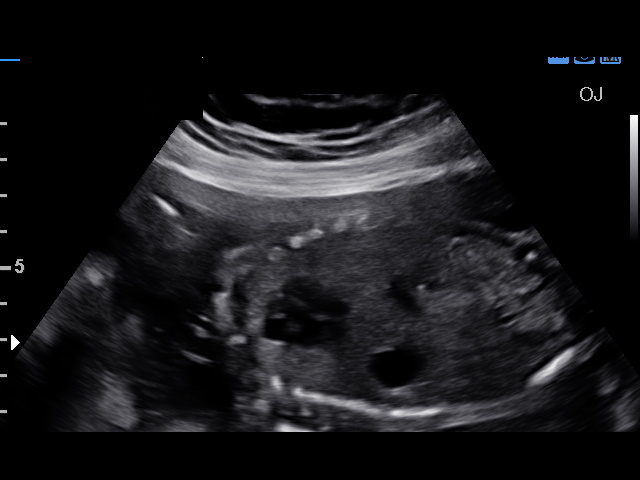
[im 53/64]
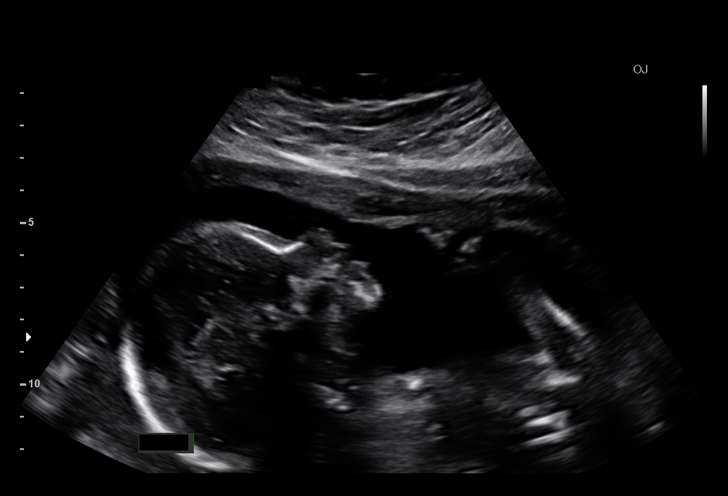
[im 60/64]
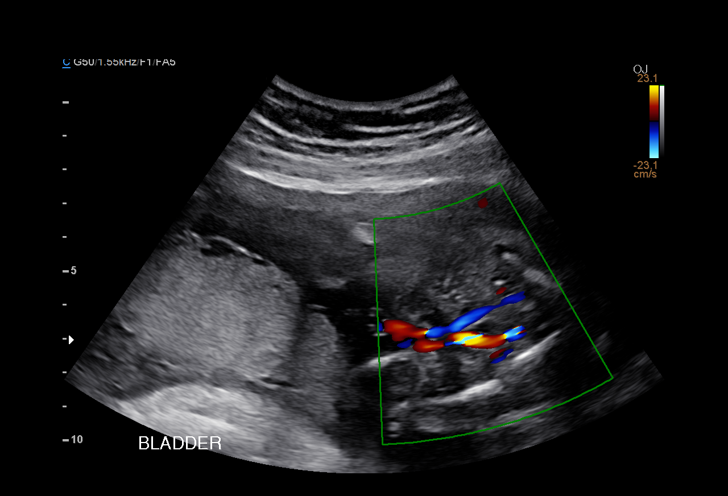

[Series 3: us mfm ob detail+14 wk · 17 acquisitions, 3 frames shown (2 of 3)]
[im 1/17]
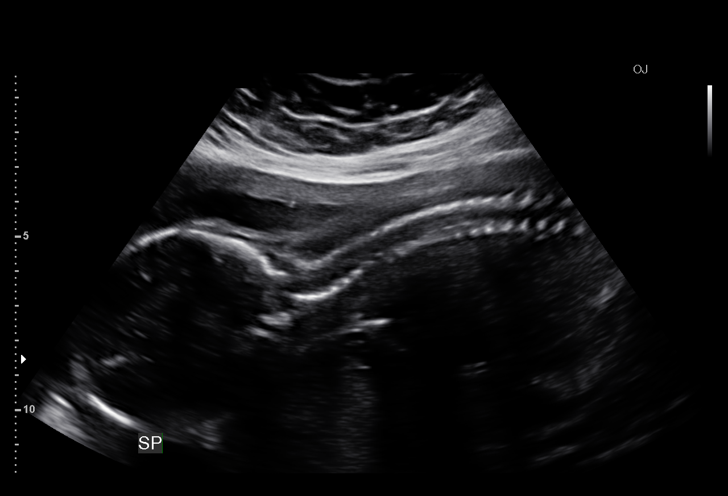
[im 9/17]
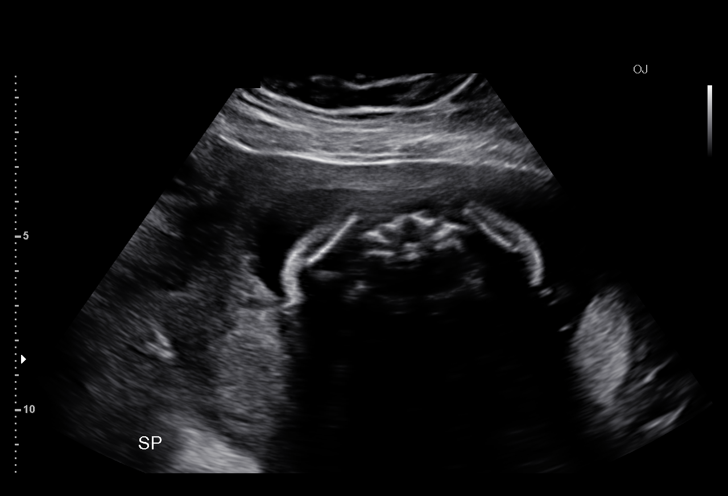
[im 17/17]
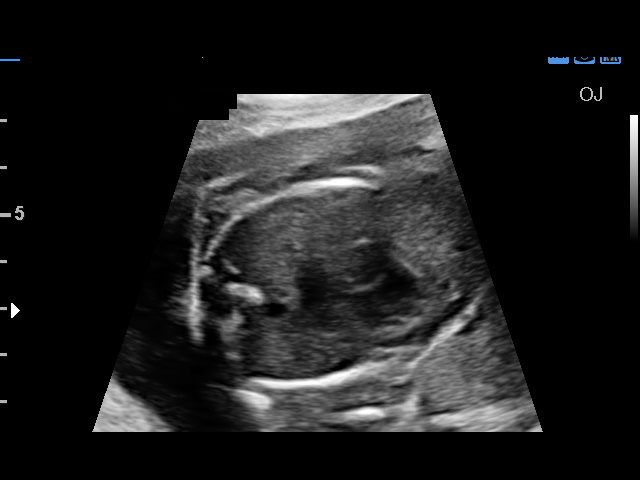

[Series 4: us mfm ob detail+14 wk · 15 acquisitions, 2 frames shown (3 of 3)]
[im 5/15]
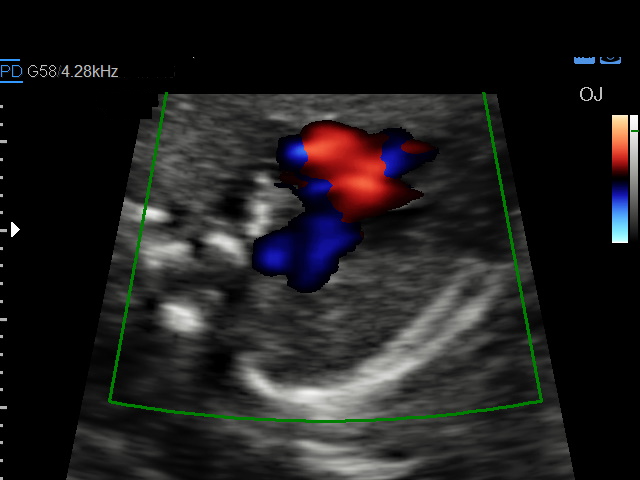
[im 15/15]
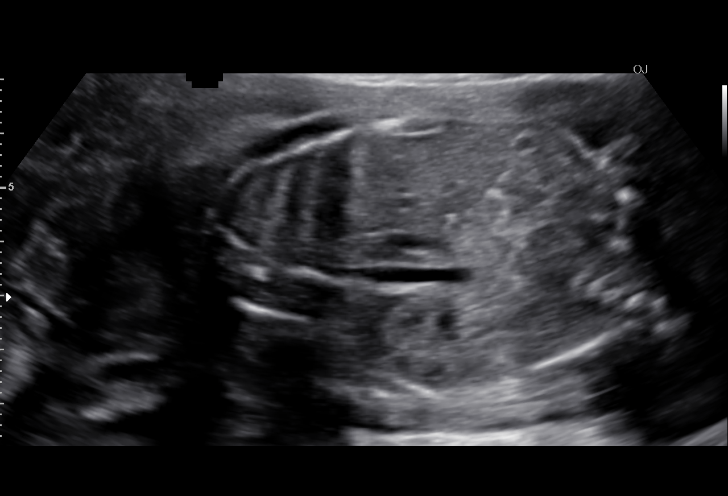

[14 of 28 positions shown; findings below may reference images not displayed]

OB/Gyn Clinic
[REDACTED]-
Faculty Physician

Indications

24 weeks gestation of pregnancy
Obesity complicating pregnancy, second
trimester
Detailed fetal anatomic survey                 Z36
Late prenatal care, second trimester
OB History

Height:       5'7"    Weight:   236        BMI:
Gravidity:    1         Term:   0        Prem:   0        SAB:   0
TOP:          0       Ectopic:  0        Living: 0
Fetal Evaluation

Num Of Fetuses:     1
Fetal Heart         146
Rate(bpm):
Cardiac Activity:   Observed
Presentation:       Breech
Placenta:           Posterior, above cervical os
P. Cord Insertion:  Visualized

Amniotic Fluid
AFI FV:      Subjectively within normal limits
Larg Pckt:     4.2  cm
Biometry
BPD:      54.2  mm     G. Age:  22w 3d                  CI:        68.79   %    70 - 86
FL/HC:      20.7   %    18.7 -
HC:      208.8  mm     G. Age:  23w 0d        < 3  %    HC/AC:      1.09        1.05 -
AC:      192.1  mm     G. Age:  23w 6d         26  %    FL/BPD:     79.7   %    71 - 87
FL:       43.2  mm     G. Age:  24w 1d         29  %    FL/AC:      22.5   %    20 - 24

Est. FW:     635  gm      1 lb 6 oz     39  %
Gestational Age

LMP:           24w 3d        Date:  07/03/14                 EDD:   04/09/15
U/S Today:     23w 3d                                        EDD:   04/16/15
Best:          24w 3d     Det. By:  LMP  (07/03/14)          EDD:   04/09/15
Anatomy

Cranium:          Appears normal         Aortic Arch:      Appears normal
Fetal Cavum:      Appears normal         Ductal Arch:      Appears normal
Ventricles:       Appears normal         Diaphragm:        Appears normal
Choroid Plexus:   Appears normal         Stomach:          Appears normal, left
sided
Cerebellum:       Appears normal         Abdomen:          Appears normal
Posterior Fossa:  Appears normal         Abdominal Wall:   Not well visualized
Nuchal Fold:      Not applicable (>20    Cord Vessels:     Appears normal (3
wks GA)                                  vessel cord)
Face:             Appears normal         Kidneys:          Appear normal
(orbits and profile)
Lips:             Not well visualized    Bladder:          Appears normal
Heart:            Appears normal         Spine:            Appears normal
(4CH, axis, and
situs)
RVOT:             Appears normal         Upper             Appears normal
Extremities:
LVOT:             Appears normal         Lower             Appears normal
Extremities:

Other:  Male gender. Technically difficult due to maternal habitus and fetal
position.
Cervix Uterus Adnexa

Cervix
Length:            3.3  cm.
Normal appearance by transabdominal scan.

Left Ovary
Size(cm)     4.01   x   2.63   x  2.48      Vol(ml):

Right Ovary
Not visualized. No adnexal mass visualized.
Impression

Singleton intrauterine pregnancy at 24 weeks 3 days
gestation with fetal cardiac activity
Breech presentation
Posterior placenta without evidence of previa
Normal appearing fetal growth and amniotic fluid volume
No apparent birth defects, but not all anatomic structures
noted on fetal anatomic survey today secondary to fetal
position and maternal habitus
Normal appearing cervical length
Recommendations

Recommend follow-up ultrasound examination to complete
fetal anatomic survey

## 2016-09-17 IMAGING — US US TRANSVAGINAL NON-OB
1 series · 15 of 25 positions shown · non-contrast
Comparison: None

CLINICAL DATA: Pelvic pain and bleeding, 6 weeks postpartum, recent
contraceptive placement



[Series 1: us transvaginal non-ob · 55 acquisitions, 15 frames shown]
[im 1/55]
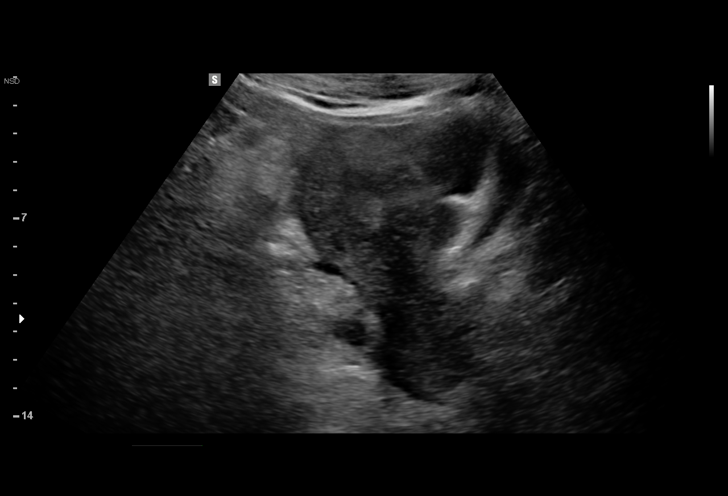
[im 5/55]
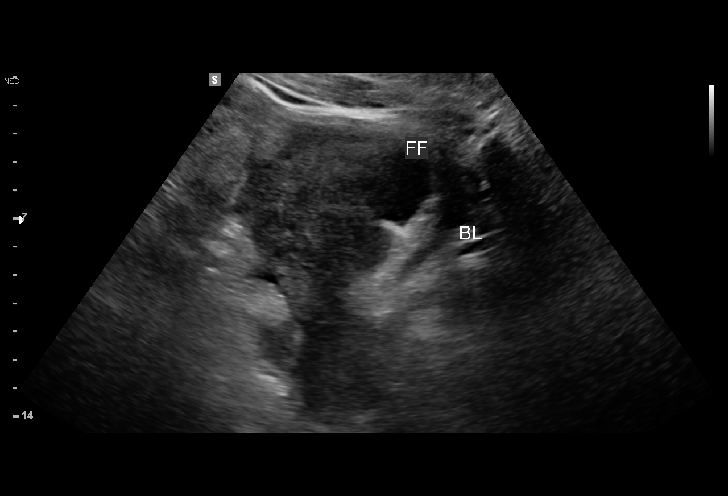
[im 10/55]
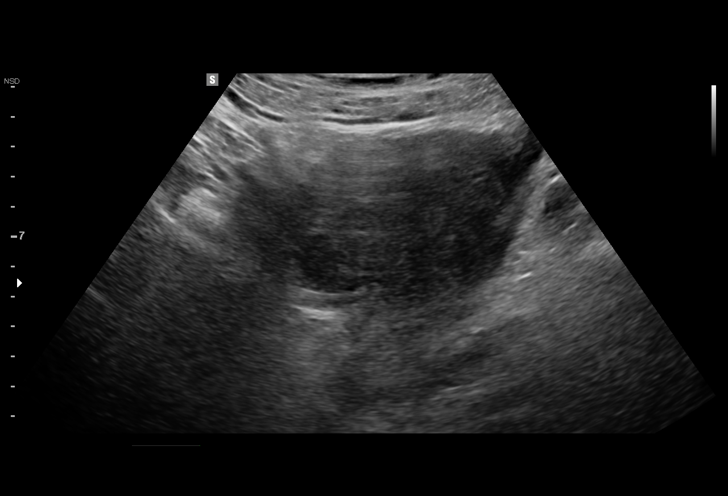
[im 12/55]
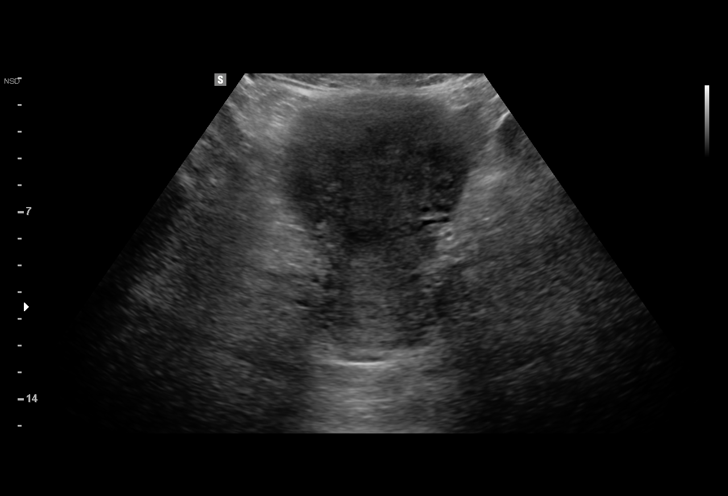
[im 16/55]
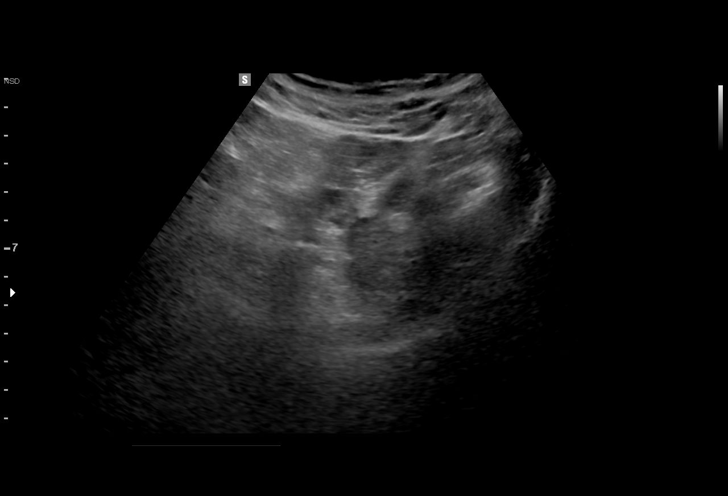
[im 21/55]
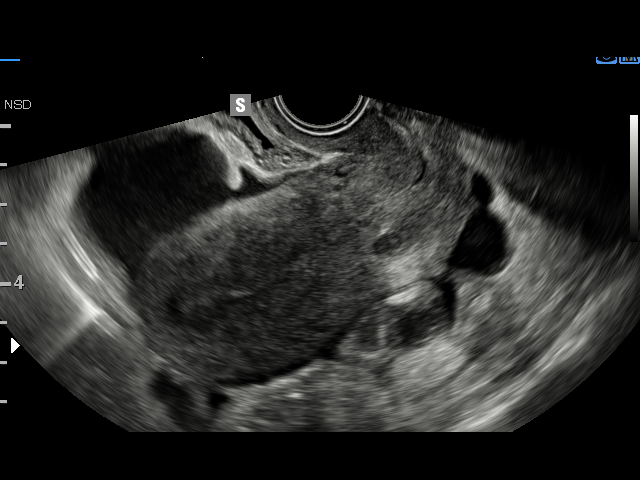
[im 23/55]
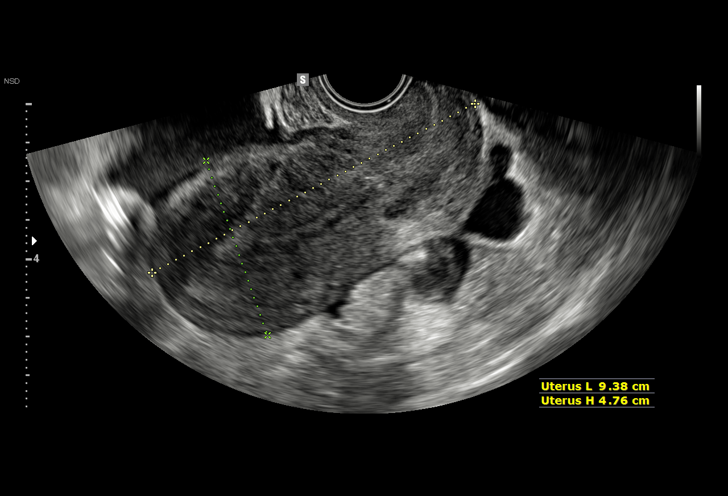
[im 28/55]
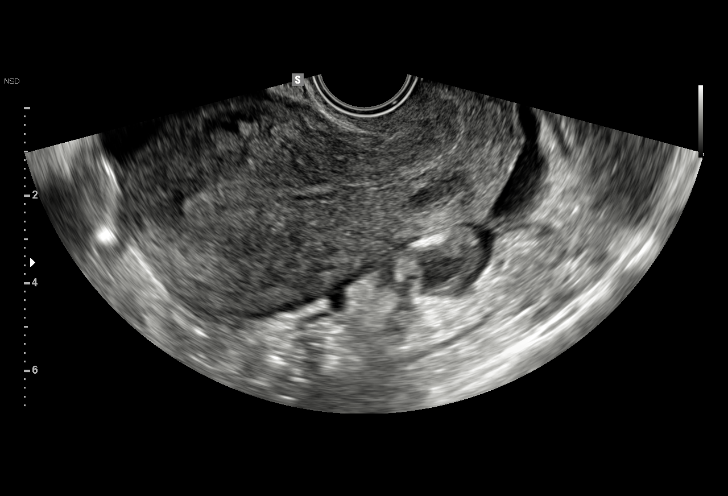
[im 32/55]
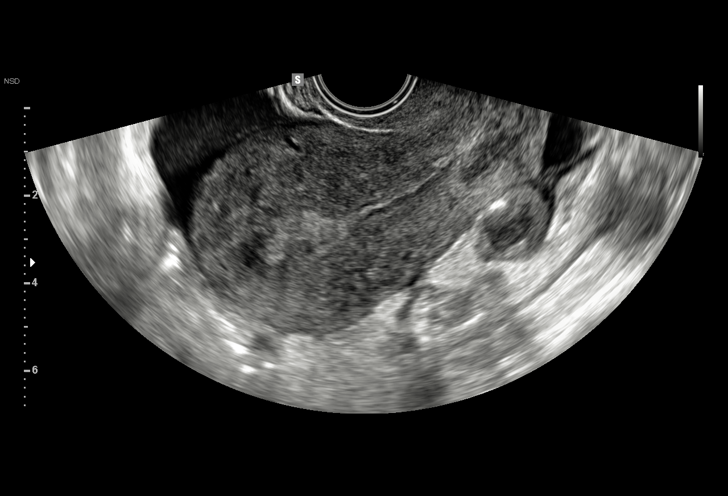
[im 34/55]
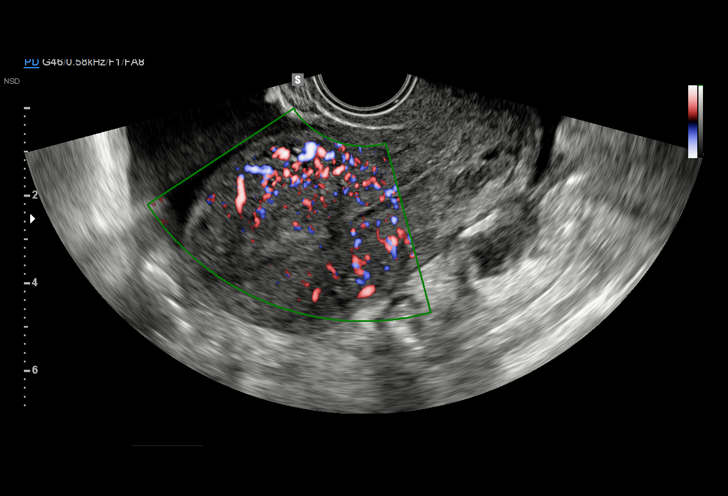
[im 39/55]
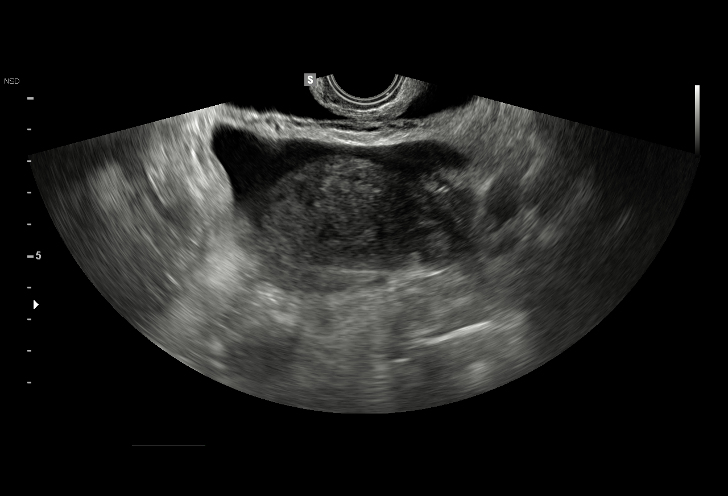
[im 43/55]
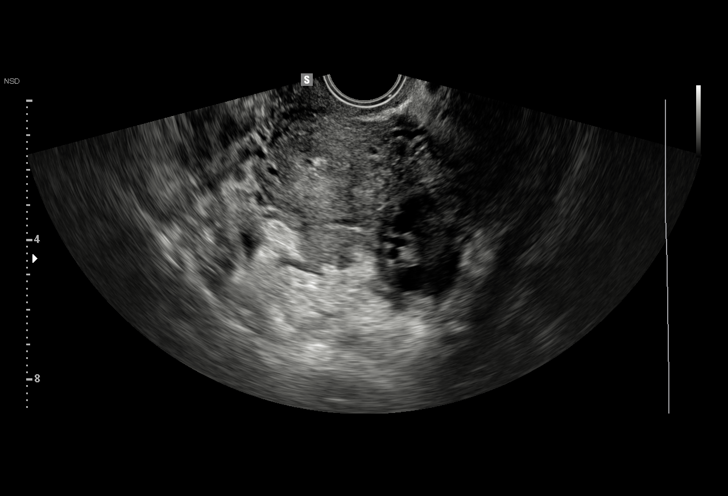
[im 46/55]
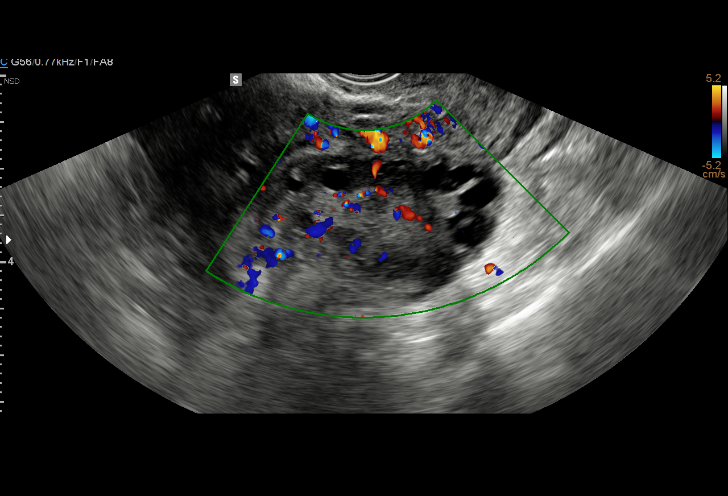
[im 50/55]
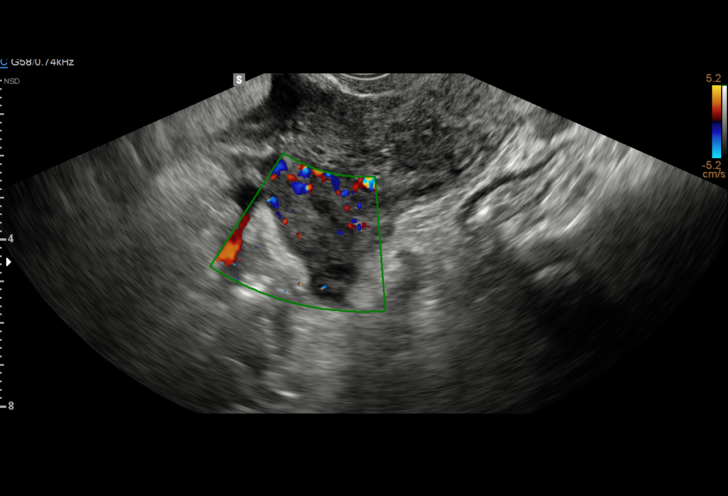
[im 55/55]
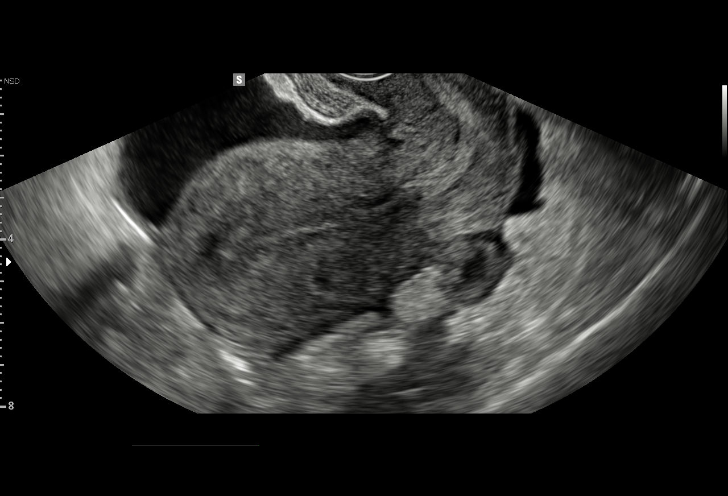

[15 of 25 positions shown; findings below may reference images not displayed]

FINDINGS: Uterus

Measurements: 9.4 x 4.8 x 6.5 cm.. No fibroids or other mass
visualized.

Endometrium

Thickness: 10.3 mm.. There is a focal area of increased echogenicity
identified adjacent to the endometrium eccentric to the right. This
may simply represent some irregularity of the endometrium. No
definitive mass lesion is seen. No increased vascularity to suggest
retained products of conception is noted. No other focal abnormality
is seen.

Right ovary

Measurements: 3.4 x 1.8 x 4.0 cm.. Normal appearance/no adnexal
mass.

Left ovary

Measurements: 4.5 x 3.4 x 3.3 cm.. Normal appearance/no adnexal
mass.

Other findings

Moderate amount of echogenic free fluid is seen. This raises
suspicion for hemorrhage. This could be related to a ruptured cyst.
Clinical correlation is recommended.
IMPRESSION: Irregular area of increased echogenicity adjacent to the endometrium
as described. This is of un known etiology but does not appear to
represent retained products of conception.

Echogenic free fluid within the pelvis suggestive of hemorrhage.
This may be related to a ruptured cyst. Followup examination can be
performed as clinically indicated.

## 2018-12-10 ENCOUNTER — Ambulatory Visit: Payer: Managed Care, Other (non HMO) | Admitting: Obstetrics & Gynecology

## 2018-12-31 ENCOUNTER — Ambulatory Visit (INDEPENDENT_AMBULATORY_CARE_PROVIDER_SITE_OTHER): Payer: Medicaid Other | Admitting: Medical

## 2018-12-31 ENCOUNTER — Encounter: Payer: Self-pay | Admitting: Medical

## 2018-12-31 ENCOUNTER — Other Ambulatory Visit: Payer: Self-pay

## 2018-12-31 ENCOUNTER — Other Ambulatory Visit (HOSPITAL_COMMUNITY)
Admission: RE | Admit: 2018-12-31 | Discharge: 2018-12-31 | Disposition: A | Discharge status: Home / Self Care | Payer: Medicaid Other | Source: Ambulatory Visit | Attending: Obstetrics & Gynecology | Admitting: Obstetrics & Gynecology

## 2018-12-31 VITALS — BP 131/83 | HR 79 | Ht 67.0 in | Wt 254.5 lb

## 2018-12-31 DIAGNOSIS — Z3046 Encounter for surveillance of implantable subdermal contraceptive: Secondary | ICD-10-CM | POA: Diagnosis present

## 2018-12-31 DIAGNOSIS — Z01419 Encounter for gynecological examination (general) (routine) without abnormal findings: Secondary | ICD-10-CM | POA: Diagnosis present

## 2018-12-31 DIAGNOSIS — Z30430 Encounter for insertion of intrauterine contraceptive device: Secondary | ICD-10-CM

## 2018-12-31 MED ORDER — LEVONORGESTREL 19.5 MCG/DAY IU IUD
INTRAUTERINE_SYSTEM | Freq: Once | INTRAUTERINE | Status: AC
  Administered 2018-12-31: 1 via INTRAUTERINE

## 2018-12-31 NOTE — Progress Notes (Signed)
Pt not sure what type of BC she wants after Nexplanon is removed.

## 2018-12-31 NOTE — Patient Instructions (Addendum)
Levonorgestrel intrauterine device (IUD) What is this medicine? LEVONORGESTREL IUD (LEE voe nor jes trel) is a contraceptive (birth control) device. The device is placed inside the uterus by a healthcare professional. It is used to prevent pregnancy. This device can also be used to treat heavy bleeding that occurs during your period. This medicine may be used for other purposes; ask your health care provider or pharmacist if you have questions. COMMON BRAND NAME(S): Kyleena, LILETTA, Mirena, Skyla What should I tell my health care provider before I take this medicine? They need to know if you have any of these conditions:  abnormal Pap smear  cancer of the breast, uterus, or cervix  diabetes  endometritis  genital or pelvic infection now or in the past  have more than one sexual partner or your partner has more than one partner  heart disease  history of an ectopic or tubal pregnancy  immune system problems  IUD in place  liver disease or tumor  problems with blood clots or take blood-thinners  seizures  use intravenous drugs  uterus of unusual shape  vaginal bleeding that has not been explained  an unusual or allergic reaction to levonorgestrel, other hormones, silicone, or polyethylene, medicines, foods, dyes, or preservatives  pregnant or trying to get pregnant  breast-feeding How should I use this medicine? This device is placed inside the uterus by a health care professional. Talk to your pediatrician regarding the use of this medicine in children. Special care may be needed. Overdosage: If you think you have taken too much of this medicine contact a poison control center or emergency room at once. NOTE: This medicine is only for you. Do not share this medicine with others. What if I miss a dose? This does not apply. Depending on the brand of device you have inserted, the device will need to be replaced every 3 to 6 years if you wish to continue using this type  of birth control. What may interact with this medicine? Do not take this medicine with any of the following medications:  amprenavir  bosentan  fosamprenavir This medicine may also interact with the following medications:  aprepitant  armodafinil  barbiturate medicines for inducing sleep or treating seizures  bexarotene  boceprevir  griseofulvin  medicines to treat seizures like carbamazepine, ethotoin, felbamate, oxcarbazepine, phenytoin, topiramate  modafinil  pioglitazone  rifabutin  rifampin  rifapentine  some medicines to treat HIV infection like atazanavir, efavirenz, indinavir, lopinavir, nelfinavir, tipranavir, ritonavir  St. John's wort  warfarin This list may not describe all possible interactions. Give your health care provider a list of all the medicines, herbs, non-prescription drugs, or dietary supplements you use. Also tell them if you smoke, drink alcohol, or use illegal drugs. Some items may interact with your medicine. What should I watch for while using this medicine? Visit your doctor or health care professional for regular check ups. See your doctor if you or your partner has sexual contact with others, becomes HIV positive, or gets a sexual transmitted disease. This product does not protect you against HIV infection (AIDS) or other sexually transmitted diseases. You can check the placement of the IUD yourself by reaching up to the top of your vagina with clean fingers to feel the threads. Do not pull on the threads. It is a good habit to check placement after each menstrual period. Call your doctor right away if you feel more of the IUD than just the threads or if you cannot feel the threads at   all. The IUD may come out by itself. You may become pregnant if the device comes out. If you notice that the IUD has come out use a backup birth control method like condoms and call your health care provider. Using tampons will not change the position of the  IUD and are okay to use during your period. This IUD can be safely scanned with magnetic resonance imaging (MRI) only under specific conditions. Before you have an MRI, tell your healthcare provider that you have an IUD in place, and which type of IUD you have in place. What side effects may I notice from receiving this medicine? Side effects that you should report to your doctor or health care professional as soon as possible:  allergic reactions like skin rash, itching or hives, swelling of the face, lips, or tongue  fever, flu-like symptoms  genital sores  high blood pressure  no menstrual period for 6 weeks during use  pain, swelling, warmth in the leg  pelvic pain or tenderness  severe or sudden headache  signs of pregnancy  stomach cramping  sudden shortness of breath  trouble with balance, talking, or walking  unusual vaginal bleeding, discharge  yellowing of the eyes or skin Side effects that usually do not require medical attention (report to your doctor or health care professional if they continue or are bothersome):  acne  breast pain  change in sex drive or performance  changes in weight  cramping, dizziness, or faintness while the device is being inserted  headache  irregular menstrual bleeding within first 3 to 6 months of use  nausea This list may not describe all possible side effects. Call your doctor for medical advice about side effects. You may report side effects to FDA at 1-800-FDA-1088. Where should I keep my medicine? This does not apply. NOTE: This sheet is a summary. It may not cover all possible information. If you have questions about this medicine, talk to your doctor, pharmacist, or health care provider.  2020 Elsevier/Gold Standard (2017-11-05 13:22:01) IUD PLACEMENT POST-PROCEDURE INSTRUCTIONS  1. You may take Ibuprofen, Aleve or Tylenol for pain if needed.  Cramping should resolve within in 24 hours.  2. You may have a small  amount of spotting.  You should wear a mini pad for the next few days.  3. You may have intercourse after 24 hours.  If you using this for birth control, it is effective immediately.  4. You need to call if you have any pelvic pain, fever, heavy bleeding or foul smelling vaginal discharge.  Irregular bleeding is common the first several months after having an IUD placed. You do not need to call for this reason unless you are concerned.  5. Shower or bathe as normal  6. You should have a follow-up appointment in 4-8 weeks for a re-check to make sure you are not having any problems. 

## 2018-12-31 NOTE — Progress Notes (Signed)
History:  [name] is a 28 y.o. G1P1001 who presents to clinic today for Nexplanon removal. Nexplanon was placed the birth of her child in 2017. She is not currently sexually active. She would like something else for birth control today. She is also due for a pap smear. Last pap was 2016 and normal. She denies any other GYN concerns.    The following portions of the patient's history were reviewed and updated as appropriate: allergies, current medications, family history, past medical history, social history, past surgical history and problem list.  Review of Systems:  Review of Systems  Constitutional: Negative for fever and malaise/fatigue.  Gastrointestinal: Negative for abdominal pain.  Genitourinary:       Neg - vaginal bleeding, discharge      Objective:  Physical Exam BP 131/83   Pulse 79   Ht 5\' 7"  (1.702 m)   Wt 254 lb 8 oz (115.4 kg)   BMI 39.86 kg/m  Physical Exam  Nursing note and vitals reviewed. Constitutional: She is oriented to person, place, and time. She appears well-developed and well-nourished. No distress.  HENT:  Head: Normocephalic and atraumatic.  Eyes: EOM are normal.  Cardiovascular: Normal rate.  Respiratory: Effort normal.  GI: Soft. She exhibits no distension and no mass. There is no abdominal tenderness. There is no rebound and no guarding.  Genitourinary: Cervix exhibits no motion tenderness, no discharge and no friability.    Vaginal bleeding (scant) present.     No vaginal discharge.  There is bleeding (scant) in the vagina.  Musculoskeletal:     Cervical back: Normal range of motion.  Neurological: She is alert and oriented to person, place, and time.  Skin: Skin is warm and dry. No erythema.  Psychiatric: She has a normal mood and affect.   GYNECOLOGY CLINIC PROCEDURE NOTE  Nexplanon Removal Patient was given informed consent for removal of her Nexplanon.  Appropriate time out taken. Nexplanon site identified.  Area prepped  in usual sterile fashon. Three ml of 1% lidocaine was used to anesthetize the area at the distal end of the implant. A small stab incision was made right beside the implant on the distal portion.  The Nexplanon rod was grasped using hemostats and removed without difficulty.  There was minimal blood loss. There were no complications.  A small amount of antibiotic ointment and steri-strips were applied over the small incision.  A pressure bandage was applied to reduce any bruising.  The patient tolerated the procedure well and was given post procedure instructions.  Patient is planning to use IUD for contraception.   IUD Insertion Procedure Note Patient identified, informed consent performed.  Discussed risks of irregular bleeding, cramping, infection, malpositioning or misplacement of the IUD outside the uterus which may require further procedure such as laparoscopy. Time out was performed.    Speculum placed in the vagina.  Cervix visualized.  Cleaned with Betadine x 2.  Grasped anteriorly with a single tooth tenaculum.  Uterus sounded to 8 cm. Lilletta IUD placed per manufacturer's recommendations.  Strings trimmed to 3 cm. Tenaculum was removed, good hemostasis noted.  Patient tolerated procedure well.   Patient was given post-procedure instructions.  She was advised to be have backup contraception for one week.  Patient was also asked to check IUD strings periodically.     Assessment & Plan:  1. Nexplanon removal - expired and patient attributes weight gain, irregular bleeding with this birth control   2. Encounter for annual routine gynecological examination -  Cytology - PAP( [facility])  3. Encounter for IUD insertion - Desires IUD for birth control today after birth control counseling   Patient advised to check strings periodically and let us know if she has severe pain or heavy bleeding Will defer in-person string check due to COVID unless patient has concerning symptoms  Return in 1  year for annual exam or sooner PRN  Danielle Rankin 12/31/2018 11:27 AM

## 2018-12-31 NOTE — Addendum Note (Signed)
Addended by: Louisa Second E on: 12/31/2018 11:55 AM   Modules accepted: Orders

## 2019-01-01 LAB — CYTOLOGY - PAP: Diagnosis: NEGATIVE

## 2019-02-03 ENCOUNTER — Encounter: Payer: Self-pay | Admitting: General Practice

## 2019-08-28 ENCOUNTER — Other Ambulatory Visit: Payer: Self-pay

## 2019-08-28 ENCOUNTER — Emergency Department (HOSPITAL_COMMUNITY): Payer: Medicaid Other

## 2019-08-28 ENCOUNTER — Emergency Department (HOSPITAL_COMMUNITY)
Admission: EM | Admit: 2019-08-28 | Discharge: 2019-08-28 | Disposition: A | Discharge status: Home / Self Care | Payer: Medicaid Other | Attending: Emergency Medicine | Admitting: Emergency Medicine

## 2019-08-28 ENCOUNTER — Encounter (HOSPITAL_COMMUNITY): Payer: Self-pay | Admitting: Pediatrics

## 2019-08-28 DIAGNOSIS — U071 COVID-19: Secondary | ICD-10-CM | POA: Insufficient documentation

## 2019-08-28 DIAGNOSIS — R05 Cough: Secondary | ICD-10-CM | POA: Diagnosis present

## 2019-08-28 DIAGNOSIS — Z87891 Personal history of nicotine dependence: Secondary | ICD-10-CM | POA: Insufficient documentation

## 2019-08-28 MED ORDER — AEROCHAMBER PLUS FLO-VU LARGE MISC: 1.0000 | Freq: Once | Status: AC

## 2019-08-28 MED ORDER — AEROCHAMBER PLUS FLO-VU LARGE MISC
Status: AC
  Administered 2019-08-28: 1
  Filled 2019-08-28: qty 1

## 2019-08-28 MED ORDER — ALBUTEROL SULFATE HFA 108 (90 BASE) MCG/ACT IN AERS
2.0000 | INHALATION_SPRAY | Freq: Once | RESPIRATORY_TRACT | Status: AC
Start: 2019-08-28 — End: 2019-08-28
  Administered 2019-08-28: 2 via RESPIRATORY_TRACT
  Filled 2019-08-28: qty 6.7

## 2019-08-28 MED ORDER — BENZONATATE 100 MG PO CAPS: 100.0000 mg | ORAL_CAPSULE | Freq: Three times a day (TID) | ORAL | 0 refills | Status: AC

## 2019-08-28 NOTE — ED Triage Notes (Signed)
Patient stated recently diagnose with covid. C/o worsening cough and difficulty breathing.

## 2019-08-28 NOTE — Discharge Instructions (Signed)
Take 2 puffs of the albuterol inhaler every 4-6 hours as needed for shortness of breath  Take the cough medication as directed  You should be isolated for at least 7 days since the onset of your symptoms AND >72 hours after symptoms resolution (absence of fever without the use of fever reducing medicaiton and improvement in respiratory symptoms), whichever is longer  Please follow up with your primary care provider within 5-7 days for re-evaluation of your symptoms. If you do not have a primary care provider, information for a healthcare clinic has been provided for you to make arrangements for follow up care. Please return to the emergency department for any new or worsening symptoms.

## 2019-08-28 NOTE — ED Provider Notes (Signed)
[facility] EMERGENCY DEPARTMENT Provider Note   CSN: [id number] Arrival date & time: 08/28/19  1633     History Chief Complaint  Patient presents with  . Cough    Lisa Chang is a 29 y.o. female.  HPI   29 year old female presenting for evaluation of cough and shortness of breath.  Patient started becoming symptomatic of Covid 11 days ago.  She was diagnosed with Covid on 8/12.  She initially had fevers and body aches which is since resolved.  She denies any chest pain, nausea or vomiting.  She has had some diarrhea.  She is been taking over-the-counter medications without relief.  She is unvaccinated against covid.  Past Medical History:  Diagnosis Date  . Herniated disc, cervical    sees chiropractor    There are no problems to display for this patient.   Past Surgical History:  Procedure Laterality Date  . NO PAST SURGERIES    . WISDOM TOOTH EXTRACTION       OB History    Gravida  1   Para  1   Term  1   Preterm  0   AB  0   Living  1     SAB  0   TAB  0   Ectopic  0   Multiple  0   Live Births  1           Family History  Problem Relation Age of Onset  . Diabetes Maternal Grandfather   . Asthma Mother   . Diabetes Maternal Aunt   . Diabetes Maternal Grandmother   . Diabetes Paternal Grandfather     Social History   Tobacco Use  . Smoking status: Former Smoker    Years: 1.00  . Smokeless tobacco: Never Used  Substance Use Topics  . Alcohol use: No  . Drug use: No    Home Medications Prior to Admission medications   Medication Sig Start Date End Date Taking? Authorizing Provider  azithromycin (ZITHROMAX) 500 MG tablet Take two tablets by mouth once. Patient not taking: Reported on 12/31/2018 05/23/15   Levie Heritage, DO  benzonatate (TESSALON) 100 MG capsule Take 1 capsule (100 mg total) by mouth every 8 (eight) hours for 5 days. 08/28/19 09/02/19  Aviella Disbrow S, PA-C  Beta Carotene (VITAMIN A)  25000 UNIT capsule Take 25,000 Units by mouth daily.    [provider]  ibuprofen (ADVIL,MOTRIN) 200 MG tablet Take 3 tablets (600 mg total) by mouth every 6 (six) hours as needed for headache or cramping. Patient not taking: Reported on 12/31/2018 05/21/15   Wouk, Wilfred Curtis, MD  Omega-3 Fatty Acids (OMEGA 3 PO) Take 1 capsule by mouth daily.    [provider]  Prenatal Vit-Fe Fumarate-FA (PRENATAL MULTIVITAMIN) TABS tablet Take 1 tablet by mouth daily at 12 noon.    [provider]    Allergies    Patient has no known allergies.  Review of Systems   Review of Systems  Constitutional: Positive for fever.  HENT: Negative for ear pain and sore throat.   Eyes: Negative for visual disturbance.  Respiratory: Positive for cough and shortness of breath.   Cardiovascular: Negative for chest pain.  Gastrointestinal: Positive for diarrhea. Negative for abdominal pain, constipation, nausea and vomiting.  Genitourinary: Negative for dysuria and hematuria.  Musculoskeletal: Positive for myalgias.  Skin: Negative for rash.  Neurological: Negative for syncope.  All other systems reviewed and are negative.   Physical Exam  Updated Vital Signs BP 127/85   Pulse 89   Temp 98.6 F (37 C) (Oral)   Resp (!) 31   SpO2 96%   Physical Exam Vitals and nursing note reviewed.  Constitutional:      General: She is not in acute distress.    Appearance: She is well-developed.  HENT:     Head: Normocephalic and atraumatic.  Eyes:     Conjunctiva/sclera: Conjunctivae normal.  Cardiovascular:     Rate and Rhythm: Normal rate and regular rhythm.     Heart sounds: Normal heart sounds. No murmur heard.   Pulmonary:     Effort: Pulmonary effort is normal. No respiratory distress.     Breath sounds: Normal breath sounds. No wheezing, rhonchi or rales.     Comments: Dry cough Abdominal:     General: Bowel sounds are normal.     Palpations: Abdomen is soft.     Tenderness:  There is no abdominal tenderness. There is no guarding or rebound.  Musculoskeletal:     Cervical back: Neck supple.  Skin:    General: Skin is warm and dry.  Neurological:     Mental Status: She is alert.     ED Results / Procedures / Treatments   Labs (all labs ordered are listed, but only abnormal results are displayed) Labs Reviewed - No data to display  EKG EKG Interpretation  Date/Time:  Friday August 28 2019 20:36:23 EDT Ventricular Rate:  88 PR Interval:    QRS Duration: 92 QT Interval:  355 QTC Calculation: 430 R Axis:   33 Text Interpretation: Sinus rhythm no acute ST/T changes No old tracing to compare Confirmed by Pricilla Loveless [phone]) on 08/28/2019 9:34:47 PM   Radiology DG Chest Portable 1 View  Result Date: 08/28/2019 CLINICAL DATA:  Cough EXAM: PORTABLE CHEST 1 VIEW COMPARISON:  None. FINDINGS: Single frontal view of the chest demonstrates an unremarkable cardiac silhouette. There is multifocal bilateral airspace disease, left greater than right. No effusion or pneumothorax. No acute bony abnormalities. IMPRESSION: 1. Bilateral airspace disease consistent with multifocal pneumonia versus edema. Electronically Signed   By: Sharlet Salina M.D.   On: 08/28/2019 18:27    Procedures Procedures (including critical care time)  Medications Ordered in ED Medications  albuterol (VENTOLIN HFA) 108 (90 Base) MCG/ACT inhaler 2 puff (2 puffs Inhalation Given 08/28/19 2139)  AeroChamber Plus Flo-Vu Large MISC 1 each (1 each Other Given 08/28/19 2139)    ED Course  I have reviewed the triage vital signs and the nursing notes.  Pertinent labs & imaging results that were available during my care of the patient were reviewed by me and considered in my medical decision making (see chart for details).    MDM Rules/Calculators/A&P                          29 year old female presenting for evaluation of cough and shortness of breath.  Was diagnosed with Covid earlier this  week.  Chest x-ray reviewed/interpreted and shows bilateral airspace disease consistent with multifocal pneumonia.  This consistent with known Covid infection.  EKG does not show any acute ST-T changes.  No evidence of ischemia.  Patient was personally ambulated in the room by myself and she had no episodes of hypoxia.  Sats stayed at 96 5% and above during ambulation.  She was given albuterol in the emergency department.  She will be given symptomatic medications for home.  Advised on close to  follow-up with PCP and strict return precautions. She voices understanding of the plan and reasons to return.  All questions answered.  Patient stable for discharge.  Lisa Chang was evaluated in Emergency Department on 08/28/2019 for the symptoms described in the history of present illness. She was evaluated in the context of the global COVID-19 pandemic, which necessitated consideration that the patient might be at risk for infection with the SARS-CoV-2 virus that causes COVID-19. Institutional protocols and algorithms that pertain to the evaluation of patients at risk for COVID-19 are in a state of rapid change based on information released by regulatory bodies including the CDC and federal and state organizations. These policies and algorithms were followed during the patient's care in the ED.    Final Clinical Impression(s) / ED Diagnoses Final diagnoses:  COVID-19    Rx / DC Orders ED Discharge Orders         Ordered    benzonatate (TESSALON) 100 MG capsule  Every 8 hours        08/28/19 2153           Karrie Meres, PA-C 08/28/19 2153    Pricilla Loveless, MD 08/31/19 [phone]

## 2019-08-28 NOTE — ED Notes (Signed)
D/C instructions reviewed w/ pt as well as inhaler and spacer use and medications. Follow-up care reviewed as well. Pt A&Ox4 w/ steady gait and VSS upon departure.

## 2019-08-28 NOTE — ED Notes (Signed)
Pt asking about wait time. Reassessed pt's vitals.

## 2019-09-16 ENCOUNTER — Other Ambulatory Visit: Payer: Self-pay

## 2019-09-16 ENCOUNTER — Encounter (HOSPITAL_COMMUNITY): Payer: Self-pay

## 2019-09-16 ENCOUNTER — Emergency Department (HOSPITAL_COMMUNITY)
Admission: EM | Admit: 2019-09-16 | Discharge: 2019-09-16 | Disposition: A | Discharge status: Home / Self Care | Payer: Medicaid Other | Attending: Emergency Medicine | Admitting: Emergency Medicine

## 2019-09-16 ENCOUNTER — Emergency Department (HOSPITAL_COMMUNITY): Payer: Medicaid Other

## 2019-09-16 DIAGNOSIS — J45998 Other asthma: Secondary | ICD-10-CM

## 2019-09-16 DIAGNOSIS — R5383 Other fatigue: Secondary | ICD-10-CM | POA: Insufficient documentation

## 2019-09-16 DIAGNOSIS — M549 Dorsalgia, unspecified: Secondary | ICD-10-CM | POA: Diagnosis not present

## 2019-09-16 DIAGNOSIS — R000 Tachycardia, unspecified: Secondary | ICD-10-CM | POA: Insufficient documentation

## 2019-09-16 DIAGNOSIS — Z87891 Personal history of nicotine dependence: Secondary | ICD-10-CM | POA: Diagnosis not present

## 2019-09-16 DIAGNOSIS — R079 Chest pain, unspecified: Secondary | ICD-10-CM | POA: Insufficient documentation

## 2019-09-16 DIAGNOSIS — J45909 Unspecified asthma, uncomplicated: Secondary | ICD-10-CM | POA: Diagnosis present

## 2019-09-16 LAB — CBC
HCT: 38.7 % (ref 36.0–46.0)
Hemoglobin: 12.1 g/dL (ref 12.0–15.0)
MCH: 27.4 pg (ref 26.0–34.0)
MCHC: 31.3 g/dL (ref 30.0–36.0)
MCV: 87.6 fL (ref 80.0–100.0)
Platelets: 106 10*3/uL — ABNORMAL LOW (ref 150–400)
RBC: 4.42 MIL/uL (ref 3.87–5.11)
RDW: 14.4 % (ref 11.5–15.5)
WBC: 4.9 10*3/uL (ref 4.0–10.5)
nRBC: 0 % (ref 0.0–0.2)

## 2019-09-16 LAB — BASIC METABOLIC PANEL
Anion gap: 9 (ref 5–15)
BUN: 6 mg/dL (ref 6–20)
CO2: 23 mmol/L (ref 22–32)
Calcium: 8.6 mg/dL — ABNORMAL LOW (ref 8.9–10.3)
Chloride: 105 mmol/L (ref 98–111)
Creatinine, Ser: 0.58 mg/dL (ref 0.44–1.00)
GFR calc Af Amer: >60 mL/min (ref >60)
GFR calc non Af Amer: >60 mL/min (ref >60)
Glucose, Bld: 109 mg/dL — ABNORMAL HIGH (ref 70–99)
Potassium: 4 mmol/L (ref 3.5–5.1)
Sodium: 137 mmol/L (ref 135–145)

## 2019-09-16 LAB — TROPONIN I (HIGH SENSITIVITY)
Troponin I (High Sensitivity): 4 ng/L (ref <18)
Troponin I (High Sensitivity): 4 ng/L (ref <18)

## 2019-09-16 LAB — I-STAT BETA HCG BLOOD, ED (MC, WL, AP ONLY): I-stat hCG, quantitative: <5 m[IU]/mL (ref <5)

## 2019-09-16 LAB — LIPASE, BLOOD: Lipase: 27 U/L (ref 11–51)

## 2019-09-16 MED ORDER — IOHEXOL 350 MG/ML SOLN
75.0000 mL | Freq: Once | INTRAVENOUS | Status: AC | PRN
  Administered 2019-09-16: 75 mL via INTRAVENOUS

## 2019-09-16 MED ORDER — BENZONATATE 100 MG PO CAPS
100.0000 mg | ORAL_CAPSULE | Freq: Once | ORAL | Status: AC
  Administered 2019-09-16: 100 mg via ORAL
  Filled 2019-09-16: qty 1

## 2019-09-16 MED ORDER — BENZONATATE 100 MG PO CAPS: 100.0000 mg | ORAL_CAPSULE | Freq: Three times a day (TID) | ORAL | 0 refills | Status: AC

## 2019-09-16 NOTE — ED Provider Notes (Signed)
[facility] Provider Note   CSN: [id number] Arrival date & time: 09/16/19  0744     History No chief complaint on file.   Lisa Chang is a 29 y.o. female.  The history is provided by the patient and medical records. No language interpreter was used.     29 year old female previously had Covid last month presenting today for evaluation of chest pain and back pain.  Patient was diagnosed with COVID-19 on 8/12.  Her symptoms did improve however for the past 6 days she felt that her symptoms has returned.  She described pain to her right side of chest described as sharp sensation radiates towards her back.  She also endorsed increasing nonproductive cough, as well as having increased shortness of breath.  She endorsed fatigue and chills without fever.  She mention been evaluated several days ago and had another Covid test that came back negative.  She was unvaccinated.  She does not complain of any abdominal pain runny nose sneezing or sore throat.  No prior history of PE or DVT.  Shortness of breath is worse with exertion with coughing or laughing.  Past Medical History:  Diagnosis Date  . Herniated disc, cervical    sees chiropractor    There are no problems to display for this patient.   Past Surgical History:  Procedure Laterality Date  . NO PAST SURGERIES    . WISDOM TOOTH EXTRACTION       OB History    Gravida  1   Para  1   Term  1   Preterm  0   AB  0   Living  1     SAB  0   TAB  0   Ectopic  0   Multiple  0   Live Births  1           Family History  Problem Relation Age of Onset  . Diabetes Maternal Grandfather   . Asthma Mother   . Diabetes Maternal Aunt   . Diabetes Maternal Grandmother   . Diabetes Paternal Grandfather     Social History   Tobacco Use  . Smoking status: Former Smoker    Years: 1.00  . Smokeless tobacco: Never Used  Substance Use Topics  . Alcohol use: No  . Drug  use: No    Home Medications Prior to Admission medications   Medication Sig Start Date End Date Taking? Authorizing Provider  azithromycin (ZITHROMAX) 500 MG tablet Take two tablets by mouth once. Patient not taking: Reported on 12/31/2018 05/23/15   Levie HeritageStinson, Jacob J, DO  Beta Carotene (VITAMIN A) 25000 UNIT capsule Take 25,000 Units by mouth daily.    [provider]  ibuprofen (ADVIL,MOTRIN) 200 MG tablet Take 3 tablets (600 mg total) by mouth every 6 (six) hours as needed for headache or cramping. Patient not taking: Reported on 12/31/2018 05/21/15   Wouk, Wilfred CurtisNoah Bedford, MD  Omega-3 Fatty Acids (OMEGA 3 PO) Take 1 capsule by mouth daily.    [provider]  Prenatal Vit-Fe Fumarate-FA (PRENATAL MULTIVITAMIN) TABS tablet Take 1 tablet by mouth daily at 12 noon.    [provider]    Allergies    Patient has no known allergies.  Review of Systems   Review of Systems  All other systems reviewed and are negative.   Physical Exam Updated Vital Signs BP (!) 138/93   Pulse 97   Temp 99.1 F (37.3 C) (Oral)  Resp 16   LMP  (LMP Unknown)   SpO2 100%   Physical Exam Vitals and nursing note reviewed.  Constitutional:      General: She is not in acute distress.    Appearance: She is well-developed. She is obese.  HENT:     Head: Atraumatic.  Eyes:     Conjunctiva/sclera: Conjunctivae normal.  Cardiovascular:     Rate and Rhythm: Tachycardia present.     Pulses: Normal pulses.     Heart sounds: Normal heart sounds.  Pulmonary:     Effort: Pulmonary effort is normal.     Breath sounds: Normal breath sounds. No wheezing, rhonchi or rales.  Abdominal:     Palpations: Abdomen is soft.     Tenderness: There is no abdominal tenderness.  Musculoskeletal:        General: No tenderness.     Cervical back: Neck supple.  Skin:    Findings: No rash.  Neurological:     Mental Status: She is alert and oriented to person, place, and time.  Psychiatric:         Mood and Affect: Mood normal.     ED Results / Procedures / Treatments   Labs (all labs ordered are listed, but only abnormal results are displayed) Labs Reviewed  BASIC METABOLIC PANEL - Abnormal; Notable for the following components:      Result Value   Glucose, Bld 109 (*)    Calcium 8.6 (*)    All other components within normal limits  CBC - Abnormal; Notable for the following components:   Platelets 106 (*)    All other components within normal limits  LIPASE, BLOOD  I-STAT BETA HCG BLOOD, ED ([facility], [facility], [facility] ONLY)  TROPONIN I (HIGH SENSITIVITY)  TROPONIN I (HIGH SENSITIVITY)    EKG EKG Interpretation  Date/Time:  Wednesday September 16 2019 07:48:34 EDT Ventricular Rate:  108 PR Interval:  122 QRS Duration: 76 QT Interval:  320 QTC Calculation: 428 R Axis:   24 Text Interpretation: Sinus tachycardia Minimal voltage criteria for LVH, may be normal variant ( R in aVL ) Cannot rule out Anterior infarct , age undetermined T wave abnormality, consider inferior ischemia Abnormal ECG nonspecific T waves new since Aug 2021 Confirmed by Pricilla Loveless [phone]) on 09/16/2019 11:19:46 AM   Radiology DG Chest 2 View  Result Date: 09/16/2019 CLINICAL DATA:  Shortness of breath for 2-3 days, epigastric and chest pain, had COVID August, former smoker EXAM: CHEST - 2 VIEW COMPARISON:  In 2021 FINDINGS: Normal heart size, mediastinal contours, and pulmonary vascularity. Significantly improved pulmonary infiltrates since previous exam. Minimal residual infiltrate LEFT mid lung. Linear subsegmental atelectasis RIGHT base. No pleural effusion or pneumothorax. Osseous structures unremarkable. IMPRESSION: Significantly improved pulmonary infiltrates with minimal residual in LEFT mid lung. Subsegmental atelectasis RIGHT base. Electronically Signed   By: Ulyses Southward M.D.   On: 09/16/2019 08:21   CLINICAL DATA: PE suspected. Presenting with chest pain, had COVID August, former smoker.  EXAM: CT  ANGIOGRAPHY CHEST WITH CONTRAST  TECHNIQUE: Multidetector CT imaging of the chest was performed using the standard protocol during bolus administration of intravenous contrast. Multiplanar CT image reconstructions and MIPs were obtained to evaluate the vascular anatomy.  CONTRAST: 47mL OMNIPAQUE IOHEXOL 350 MG/ML SOLN  COMPARISON: Chest x-ray 08/28/2019, chest x-ray 09/16/2019.  FINDINGS: Cardiovascular: Fairly satisfactory opacification of the pulmonary arteries to the segmental level. Unable to evaluate at the subsegmental level due to timing of contrast. No evidence of pulmonary embolism.  Normal heart size. No significant pericardial effusion.  Mediastinum/Nodes: No enlarged mediastinal, hilar, or axillary lymph nodes. Thyroid gland, trachea, and esophagus demonstrate no significant findings. Slightly patulous distal esophagus.  Lungs/Pleura: Expiratory phase of respiration with mosaic attenuation. Scattered peribronchovascular and peripheral ground-glass airspace opacities. 9 mm nodular-like subpleural right lower lobe opacity (5:44). 6 mm nodular like basilar left lower lobe opacity (5:58). Couple of other subcentimeter nodular like opacities within the left lower lobe (5:39). No pulmonary mass. No findings to suggest pulmonary infarction. No pleural effusion. No pneumothorax.  Upper Abdomen: Splenomegaly measuring up to 15 cm on axial imaging.  Musculoskeletal: No chest wall abnormality. No acute or significant osseous findings. Multilevel mild degenerative changes of the thoracic spine.  Review of the MIP images confirms the above findings.  IMPRESSION: No central or segmental pulmonary embolus.  Findings likely represent residual COVID pnuemonia. Followup PA and lateral chest X-ray is recommended in 3-4 weeks following treatment to ensure resolution.  Splenomegaly   Electronically Signed By: Tish Frederickson M.D. On: 09/16/2019 14:08  Procedures Procedures  (including critical care time)  Medications Ordered in ED Medications  benzonatate (TESSALON) capsule 100 mg (100 mg Oral Given 09/16/19 1302)  iohexol (OMNIPAQUE) 350 MG/ML injection 75 mL (75 mLs Intravenous Contrast Given 09/16/19 1352)    ED Course  I have reviewed the triage vital signs and the nursing notes.  Pertinent labs & imaging results that were available during my care of the patient were reviewed by me and considered in my medical decision making (see chart for details).    MDM Rules/Calculators/A&P                          BP (!) 138/93   Pulse 97   Temp 99.1 F (37.3 C) (Oral)   Resp 16   LMP  (LMP Unknown)   SpO2 100%   Final Clinical Impression(s) / ED Diagnoses Final diagnoses:  Post viral RAD (reactive airway disease)    Rx / DC Orders ED Discharge Orders         Ordered    benzonatate (TESSALON) 100 MG capsule  Every 8 hours        09/16/19 1452         11:38 AM Patient was sick with COVID-19 last month who got better but now reported having chest pain shortness of breath back pain and increasing cough.  She recently had another Covid test that was negative.  Today chest x-ray shows significantly improved pulmonary infiltrates with minimal residual left midlung.  There is subsegmental atelectasis in the right base.  The remainder of her labs are reassuring.  She is however tachycardic and due to recent Covid infection, I am concerned for potential pulmonary embolism causing her chest pain shortness of breath as well as potential superimposed bacterial infection.  Will obtain chest CT angiogram to rule out.  No evidence of hypoxia here.  2:49 PM Chest CT angiogram demonstrated no central or segmental pulmonary embolus.  Finding likely residual Covid pneumonia.  Follow-up PA lateral chest x-ray is recommended in 3 to 4 weeks following treatments to ensure resolution.  I did discuss this finding with patient.  Encourage patient to follow-up with our post  COVID clinic for outpatient care.  She does have an albuterol inhaler to use as needed at home for her shortness of breath.  Cough medication prescribed.  Return precaution discussed.  Lisa Chang was evaluated in Emergency Department on 09/16/2019 for  the symptoms described in the history of present illness. She was evaluated in the context of the global COVID-19 pandemic, which necessitated consideration that the patient might be at risk for infection with the SARS-CoV-2 virus that causes COVID-19. Institutional protocols and algorithms that pertain to the evaluation of patients at risk for COVID-19 are in a state of rapid change based on information released by regulatory bodies including the CDC and federal and state organizations. These policies and algorithms were followed during the patient's care in the ED.    Fayrene Helper, PA-C 09/16/19 1453    Pricilla Loveless, MD 09/17/19 1056

## 2019-09-16 NOTE — ED Triage Notes (Signed)
Patient complains of epigastric/ chest pain with radiation to scapula and body pain since Thursday. Patient had covid in august and negative covid test this past Saturday. Patient alert and oriented

## 2019-09-16 NOTE — Discharge Instructions (Signed)
Please call and follow-up closely with the post Covid clinic for further managements of your symptoms.

## 2020-12-25 IMAGING — DX DG CHEST 1V PORT
1 series · 1 of 1 positions shown · non-contrast
Comparison: None.

CLINICAL DATA: Cough

EXAM:
PORTABLE CHEST 1 VIEW

[chest ap]
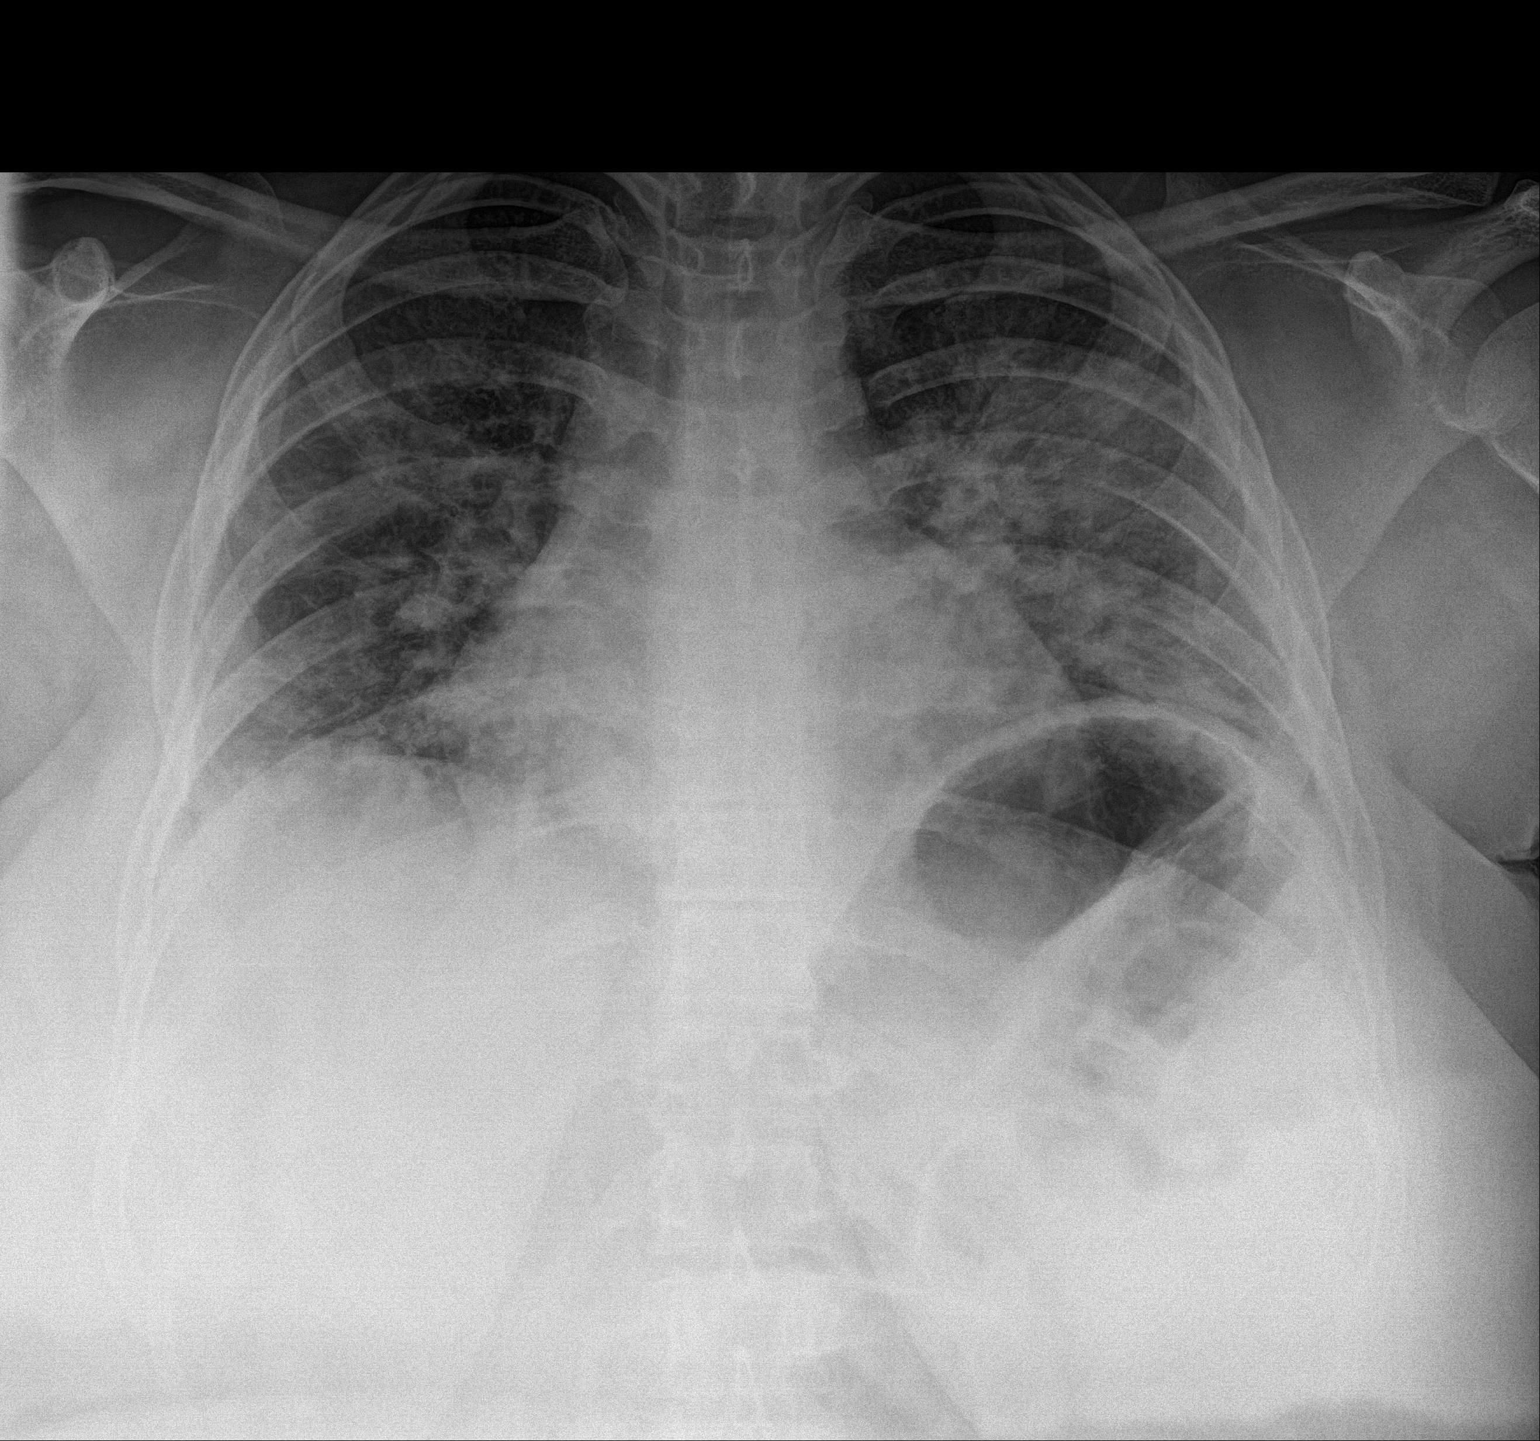

[1 of 1 positions shown; findings below may reference images not displayed]

FINDINGS: Single frontal view of the chest demonstrates an unremarkable
cardiac silhouette. There is multifocal bilateral airspace disease,
left greater than right. No effusion or pneumothorax. No acute bony
abnormalities.
IMPRESSION: 1. Bilateral airspace disease consistent with multifocal pneumonia
versus edema.
